# Patient Record
Sex: Female | Born: 1965
Health system: Southern US, Community
[De-identification: ages and names within clinical notes are randomized; demographics above are authoritative.]

## PROBLEM LIST (undated history)

## (undated) DIAGNOSIS — G43909 Migraine, unspecified, not intractable, without status migrainosus: Secondary | ICD-10-CM

## (undated) DIAGNOSIS — N751 Abscess of Bartholin's gland: Secondary | ICD-10-CM

## (undated) DIAGNOSIS — M1990 Unspecified osteoarthritis, unspecified site: Secondary | ICD-10-CM

## (undated) DIAGNOSIS — K649 Unspecified hemorrhoids: Secondary | ICD-10-CM

## (undated) DIAGNOSIS — R112 Nausea with vomiting, unspecified: Secondary | ICD-10-CM

## (undated) DIAGNOSIS — M5020 Other cervical disc displacement, unspecified cervical region: Secondary | ICD-10-CM

## (undated) DIAGNOSIS — M7989 Other specified soft tissue disorders: Secondary | ICD-10-CM

## (undated) DIAGNOSIS — N946 Dysmenorrhea, unspecified: Secondary | ICD-10-CM

## (undated) DIAGNOSIS — Z9889 Other specified postprocedural states: Secondary | ICD-10-CM

## (undated) HISTORY — DX: Abscess of Bartholin's gland: N75.1

## (undated) HISTORY — PX: ROTATOR CUFF REPAIR: SHX139

## (undated) HISTORY — PX: TYMPANOSTOMY TUBE PLACEMENT: SHX32

## (undated) HISTORY — DX: Migraine, unspecified, not intractable, without status migrainosus: G43.909

## (undated) HISTORY — PX: HEMORRHOID SURGERY: SHX153

## (undated) HISTORY — PX: COLONOSCOPY: SHX174

## (undated) HISTORY — DX: Dysmenorrhea, unspecified: N94.6

---

## 1996-02-22 HISTORY — PX: PILONIDAL CYST EXCISION: SHX744

## 1998-10-12 ENCOUNTER — Other Ambulatory Visit: Admission: RE | Admit: 1998-10-12 | Discharge: 1998-10-12 | Payer: Self-pay | Admitting: *Deleted

## 1999-10-17 ENCOUNTER — Other Ambulatory Visit: Admission: RE | Admit: 1999-10-17 | Discharge: 1999-10-17 | Payer: Self-pay | Admitting: *Deleted

## 2000-12-17 ENCOUNTER — Other Ambulatory Visit: Admission: RE | Admit: 2000-12-17 | Discharge: 2000-12-17 | Payer: Self-pay | Admitting: Obstetrics and Gynecology

## 2001-11-12 ENCOUNTER — Other Ambulatory Visit: Admission: RE | Admit: 2001-11-12 | Discharge: 2001-11-12 | Payer: Self-pay | Admitting: *Deleted

## 2002-03-23 ENCOUNTER — Encounter (INDEPENDENT_AMBULATORY_CARE_PROVIDER_SITE_OTHER): Payer: Self-pay | Admitting: *Deleted

## 2002-03-23 ENCOUNTER — Ambulatory Visit (HOSPITAL_BASED_OUTPATIENT_CLINIC_OR_DEPARTMENT_OTHER): Admission: RE | Admit: 2002-03-23 | Discharge: 2002-03-23 | Payer: Self-pay | Admitting: Surgery

## 2002-11-17 ENCOUNTER — Other Ambulatory Visit: Admission: RE | Admit: 2002-11-17 | Discharge: 2002-11-17 | Payer: Self-pay | Admitting: *Deleted

## 2003-10-11 ENCOUNTER — Encounter: Admission: RE | Admit: 2003-10-11 | Discharge: 2003-10-11 | Payer: Self-pay | Admitting: Family Medicine

## 2003-10-24 ENCOUNTER — Encounter: Admission: RE | Admit: 2003-10-24 | Discharge: 2003-10-24 | Payer: Self-pay | Admitting: Family Medicine

## 2003-11-23 ENCOUNTER — Other Ambulatory Visit: Admission: RE | Admit: 2003-11-23 | Discharge: 2003-11-23 | Payer: Self-pay | Admitting: *Deleted

## 2004-08-15 ENCOUNTER — Encounter: Admission: RE | Admit: 2004-08-15 | Discharge: 2004-08-15 | Payer: Self-pay | Admitting: Family Medicine

## 2004-10-07 ENCOUNTER — Emergency Department (HOSPITAL_COMMUNITY): Admission: EM | Admit: 2004-10-07 | Discharge: 2004-10-07 | Payer: Self-pay | Admitting: Emergency Medicine

## 2004-10-11 ENCOUNTER — Encounter: Admission: RE | Admit: 2004-10-11 | Discharge: 2004-10-11 | Payer: Self-pay | Admitting: Family Medicine

## 2004-11-29 ENCOUNTER — Ambulatory Visit (HOSPITAL_COMMUNITY): Admission: RE | Admit: 2004-11-29 | Discharge: 2004-11-30 | Payer: Self-pay | Admitting: Neurosurgery

## 2004-12-22 HISTORY — PX: CERVICAL DISCECTOMY: SHX98

## 2005-01-15 ENCOUNTER — Other Ambulatory Visit: Admission: RE | Admit: 2005-01-15 | Discharge: 2005-01-15 | Payer: Self-pay | Admitting: *Deleted

## 2006-01-16 ENCOUNTER — Other Ambulatory Visit: Admission: RE | Admit: 2006-01-16 | Discharge: 2006-01-16 | Payer: Self-pay | Admitting: Obstetrics & Gynecology

## 2006-01-27 ENCOUNTER — Encounter: Admission: RE | Admit: 2006-01-27 | Discharge: 2006-01-27 | Payer: Self-pay | Admitting: Obstetrics and Gynecology

## 2007-01-19 ENCOUNTER — Other Ambulatory Visit: Admission: RE | Admit: 2007-01-19 | Discharge: 2007-01-19 | Payer: Self-pay | Admitting: Obstetrics and Gynecology

## 2007-01-19 ENCOUNTER — Encounter: Admission: RE | Admit: 2007-01-19 | Discharge: 2007-01-19 | Payer: Self-pay | Admitting: Emergency Medicine

## 2007-02-24 ENCOUNTER — Encounter: Admission: RE | Admit: 2007-02-24 | Discharge: 2007-02-24 | Payer: Self-pay | Admitting: Emergency Medicine

## 2008-02-09 ENCOUNTER — Other Ambulatory Visit: Admission: RE | Admit: 2008-02-09 | Discharge: 2008-02-09 | Payer: Self-pay | Admitting: Obstetrics and Gynecology

## 2008-02-25 ENCOUNTER — Encounter: Admission: RE | Admit: 2008-02-25 | Discharge: 2008-02-25 | Payer: Self-pay | Admitting: Emergency Medicine

## 2009-02-02 ENCOUNTER — Encounter: Admission: RE | Admit: 2009-02-02 | Discharge: 2009-02-02 | Payer: Self-pay | Admitting: Emergency Medicine

## 2009-02-09 ENCOUNTER — Encounter: Admission: RE | Admit: 2009-02-09 | Discharge: 2009-02-09 | Payer: Self-pay | Admitting: Gastroenterology

## 2009-02-27 ENCOUNTER — Encounter: Admission: RE | Admit: 2009-02-27 | Discharge: 2009-02-27 | Payer: Self-pay | Admitting: Emergency Medicine

## 2010-03-02 ENCOUNTER — Encounter: Admission: RE | Admit: 2010-03-02 | Discharge: 2010-03-02 | Payer: Self-pay | Admitting: Family Medicine

## 2010-03-12 ENCOUNTER — Encounter: Admission: RE | Admit: 2010-03-12 | Discharge: 2010-03-12 | Payer: Self-pay | Admitting: Family Medicine

## 2010-10-14 ENCOUNTER — Encounter: Payer: Self-pay | Admitting: Family Medicine

## 2011-02-08 NOTE — Op Note (Signed)
NAMEJALINA, [name]              ACCOUNT NO.:  [id number]   MEDICAL RECORD NO.:  [id number]          PATIENT TYPE:  OIB   LOCATION:  2855                         FACILITY:  [facility]   PHYSICIAN:  Clydene Fake, M.D.  DATE OF BIRTH:  Jun 18, 1966   DATE OF PROCEDURE:  11/29/2004  DATE OF DISCHARGE:                                 OPERATIVE REPORT   DIAGNOSIS:  Herniated nucleus pulposus and spondylosis, left C5-6.   POSTOPERATIVE DIAGNOSIS:  Herniated nucleus pulposus and spondylosis, left  C5-6.   PROCEDURE:  Anterior cervical decompression, diskectomy and fusion, C5-6,  with LifeNet allograft bone and Eagle anterior cervical plates.   SURGEON:  Clydene Fake, M.D.   ASSISTANT:  Cristi Loron, M.D.   ANESTHESIA:  General endotracheal tube anesthesia.   ESTIMATED BLOOD LOSS:  Minimal.   BLOOD GIVEN:  None.   DRAINS:  None.   COMPLICATIONS:  None.   REASON FOR PROCEDURE:  The patient is a 45 year old woman with neck and left  arm pain and numbness, slight decreased sensation in the left C6 root,  diminished left biceps reflex.  MRI showed HNP on the left side at C5-6,  compressing the C6 root.  The patient was brought in for a decompression and  fusion.   PROCEDURE IN DETAIL:  The patient was brought into the operating room and  general anesthesia induced.  The patient was placed in halter traction with  10 pounds, and prepped and draped in a sterile fashion.  The area of the  incision was injected with 10 cc of 1% lidocaine with epinephrine.  Incision  was then made from the midline to the anterior border of the  sternocleidomastoid muscle on the left side of the neck.  The incision was  taken down to the platysma.  Hemostasis was obtained with Bovie  cauterization.  The Bovie was used to open the platysma.  Blunt dissection  was taken through the anterior cervical fascia.  The anterior cervical spine  needle was placed in the interspace.  X-rays were obtained  showing this was  the C5-6 interspace.  Disc space was incised with a 15 blade, and partial  diskectomy performed.  As the needle was removed, the longus coli muscles  were reflected laterally using the Bovie, and self-retaining retractor was  placed.  Diskectomy was then continued with pituitary rongeurs and curets.  Distraction pins were placed in the C5 and C6 interspace and distracted.  Anterior osteophytes were removed with Kerrison punches, 1 and 2 mm Kerrison  punches were used posteriorly to remove posterior osteophytes and ligament  and then disc herniation decompressed the ventral canal and then performed  bilateral foraminotomies.  Disc herniation was seen on the left side  lateral, compressing the C6 root, and this was removed, decompressing the  area.  __________ good central and lateral decompression bilaterally.  The  wound was irrigated with antibiotic solution.  Hemostasis was obtained with  Gelfoam and thrombin.  This was irrigated out.  Cartilaginous endplate was  drilled off with a high-speed drill.  Disc space measured to be 6 mm.  A 6  mm LifeNet allograft bone was then tapped into place and countersunk about 1  mm.  We checked posterior to the graft, and there was plenty of room between  the bone graft and dura.  Distraction was removed.  Weight was removed from  the traction, and an Eagle anterior cervical plate was placed around the  anterior cervical spine, with two screws placed into C5 and two into C6.  These were tightened down.  Lateral x-rays were obtained showing good  position of bone graft, plate, and screw at C5-6 level.  Retractor was  removed.  Hemostasis obtained with bipolar cauterization and Gelfoam and  thrombin.  The wound was irrigated with antibiotic solution, and the  platysma closed with 3-0 Vicryl interrupted suture, and the subcutaneous  tissue closed with the same, and the skin closed with Benzoin and Steri-  Strips.  Dressing was placed.   The patient was placed in a soft cervical  collar, awakened from anesthesia and transferred to the recovery room in  stable condition.      JRH/MEDQ  D:  11/29/2004  T:  11/29/2004  Job:  [id number]

## 2011-02-08 NOTE — Op Note (Signed)
[name], [name]              ACCOUNT NO.:  [id number]   MEDICAL RECORD NO.:  [id number]          PATIENT TYPE:  OIB   LOCATION:  3023                         FACILITY:  [facility]   PHYSICIAN:  Clydene Fake, M.D.  DATE OF BIRTH:  October 21, 1965   DATE OF PROCEDURE:  11/29/2004  DATE OF DISCHARGE:  11/30/2004                                 OPERATIVE REPORT   This is a redictation of a previously dictated note.   DIAGNOSIS:  Herniated nucleus pulposus, spondylosis C5-6 with left-sided  radiculopathy.   POSTOPERATIVE DIAGNOSIS:  Herniated nucleus pulposus, spondylosis C5-6 with  left-sided radiculopathy.   PROCEDURE:  Anterior cervical decompression and discectomy and fusion at 5-6  with LifeNet allograft bone and Eagle anterior cervical plate.   SURGEON:  Clydene Fake, M.D.   ASSISTANT:  Cristi Loron, M.D.   ANESTHESIA:  General endotracheal tube anesthesia.   ESTIMATED BLOOD LOSS:  Minimal.   BLOOD GIVEN:  None.   DRAINS:  None.   COMPLICATIONS:  None.   INDICATIONS FOR PROCEDURE:  Patient is a 45 year old woman who has had back  and left arm pain and numbness, found to have decreased sensation left C6  distribution, diminished reflexes in the left biceps and positive Spurling's  at the left.  An MRI is done showing disc herniation in the left side  compressing the left C6 nerve root.  Patient brought in for decompression  and fusion.   DESCRIPTION OF PROCEDURE:  Patient is brought to the operating room and  general anesthesia is induced.  Patient is placed in 10 pounds Halter  traction, prepped and draped in a sterile fashion.  Site of incision was  injected with 10 mL of 1% lidocaine with epinephrine.  Incision was then  made from the midline to the anterior border of the sternocleidomastoid  muscle and the left-sided neck incision taken down to the platysma and  hemostasis obtained with Bovie cauterization and platysma incised with the  Bovie and blunt  dissection taken down through the anterior cervical fascia,  anterior cervical spine.  Needle was placed in interspace, x-rays obtained  showing this at the 5-6 interspace.  Disc space was incised with 15 blade  and compression discectomy performed as the needle was removed.  Longus  colli muscle was reflected laterally on the inside using the Bovie and self-  retaining retractor was placed.  Microscope was brought in for  microdissection at this point and discectomy was continued with curettes and  pituitary rongeurs and then 1 and 2 mm Kerrison punches were used to remove  posterior osteophytes, posterior ligaments and disc herniation for bilateral  foraminotomies were performed.  Decompression of the nerve roots bilaterally  especially on the left side with removal of herniated disc.  Distraction  pins were placed in the C5 and 6 interspace bones and the interspace  distracted prior to the discectomy.  Then cartilaginous end plates were  drilled with a high speed drill and height of the vertebral body was  measured with the LifeNet bone trials.  LifeNet allograft bone was then  tapped into place  __________ about a millimeter.  We checked posterior to  the graft and there was plenty of room between the dura and the bone graft.  Wound was irrigated with antibiotic solution.  Distraction was removed.  Weight was removed from the traction and it was used at the beginning of the  case.  We still had good position of the bone graft.  Eagle anterior  cervical plate was placed on the anterior cervical spine and two screws  placed in the C5, two in the C6.  These were tightened down.  Another x-ray  was obtained showing he had fusion 5-6 level with good position of bone  graft, plate and screws.  Retractors removed.  Hemostasis obtained with  bipolar cauterization, Gelfoam and thrombin.  Gelfoam was irrigated out when  he had good hemostasis, then platysma was closed with 3-0 Vicryl interrupted   sutures, the subcutaneous tissue closed with same, skin closed with Benzoin  and Steri-Strips.  Dressing was placed, patient was placed back in soft  cervical collar, awoken from anesthesia and transferred to the recovery room  in stable condition.      JRH/MEDQ  D:  12/27/2004  T:  12/27/2004  Job:  [id number]   cc:   Talmadge Coventry, M.D.  [address]  [place]  [place] [address]  Fax: [phone]

## 2011-02-08 NOTE — Op Note (Signed)
[facility]  Patient:    [name], [name] Visit Number: [id number] MRN: [id number]          Service Type: DSU Location: [facility] Attending Physician:  Bonnetta Barry Dictated by:   Velora Heckler, M.D. Proc. Date: 03/23/02 Admit Date:  03/23/2002 Discharge Date: 03/23/2002   CC:         Talmadge Coventry, M.D.   Operative Report  PREOPERATIVE DIAGNOSIS:  Complex hemorrhoid with prolapse and thrombosis.  POSTOPERATIVE DIAGNOSIS:  Complex hemorrhoid with prolapse and thrombosis.  OPERATION PERFORMED:  Hemorrhoidectomy (left lateral column).  SURGEON:  Velora Heckler, M.D.  ANESTHESIA:  General.  ESTIMATED BLOOD LOSS:  Minimal.  PREPARATION:  Betadine.  COMPLICATIONS:  None.  INDICATIONS FOR PROCEDURE:  The patient is a 45 year old white female known to my practice with prior hemorrhoidectomy in March of 1999.  The patient has now developed prolapsed, intermittent bleeding and pain.  She failed conservative management with tub soaks, stool softeners, and topical steroid creams.  She now comes to surgery for excision.  DESCRIPTION OF PROCEDURE:  The procedure was done in OR #2 at the [facility].  The patient was brought to the operating room and placed in supine position on the operating room table.  Following administration of general anesthesia, the patient was placed in lithotomy and prepped and draped in the usual strict aseptic fashion.  After ascertaining that an adequate level of anesthesia had been obtained, the digital rectal exam was performed. The left lateral column is prominent with prolapse and thrombosis.  Anoscopy was performed.  Local anesthetic with epinephrine was injected all along the hemorrhoidal column and onto the perianal skin.  A V-shaped incision was then made on the anoderm.  Dissection was carried into the subcutaneous tissues. Dissection was carried proximally.  A 3-0 chromic gut  suture ligature was placed at the apex of the hemorrhoidal column.  The entire hemorrhoidal column was then dissected away from the sphincter muscle and into the dentate line with sharp dissection.  The column was excised and submitted as specimen.  The mucosa was reapproximated with running 3-0 chromic gut locking suture.  The suture was carried over the sphincter musculature and onto the anoderm. Further local anesthetic was injected.  Good hemostasis was noted.   Repeat anoscopy was performed.  No other prominent columns were visible.  Gelfoam was placed in the anal canal.  Sterile gauze dressings were placed on the perineum.  The patient was taken out of lithotomy and awakened from anesthesia.  She was brought to the recovery room in stable condition.  The patient tolerated the procedure well. Dictated by:   Velora Heckler, M.D. Attending Physician:  Bonnetta Barry DD:  03/23/02 TD:  03/24/02 Job: [id number]

## 2011-02-12 ENCOUNTER — Other Ambulatory Visit: Payer: Self-pay | Admitting: Family Medicine

## 2011-02-12 DIAGNOSIS — Z1231 Encounter for screening mammogram for malignant neoplasm of breast: Secondary | ICD-10-CM

## 2011-03-15 ENCOUNTER — Ambulatory Visit
Admission: RE | Admit: 2011-03-15 | Discharge: 2011-03-15 | Disposition: A | Discharge status: Home / Self Care | Payer: BC Managed Care – PPO | Source: Ambulatory Visit | Attending: Family Medicine | Admitting: Family Medicine

## 2011-03-15 DIAGNOSIS — Z1231 Encounter for screening mammogram for malignant neoplasm of breast: Secondary | ICD-10-CM

## 2012-01-17 ENCOUNTER — Other Ambulatory Visit: Payer: Self-pay | Admitting: Family Medicine

## 2012-01-17 DIAGNOSIS — N6311 Unspecified lump in the right breast, upper outer quadrant: Secondary | ICD-10-CM

## 2012-01-20 ENCOUNTER — Ambulatory Visit
Admission: RE | Admit: 2012-01-20 | Discharge: 2012-01-20 | Disposition: A | Discharge status: Home / Self Care | Payer: BC Managed Care – PPO | Source: Ambulatory Visit | Attending: Family Medicine | Admitting: Family Medicine

## 2012-01-20 DIAGNOSIS — N6311 Unspecified lump in the right breast, upper outer quadrant: Secondary | ICD-10-CM

## 2012-03-02 ENCOUNTER — Other Ambulatory Visit: Payer: Self-pay | Admitting: Family Medicine

## 2012-03-02 DIAGNOSIS — Z1231 Encounter for screening mammogram for malignant neoplasm of breast: Secondary | ICD-10-CM

## 2012-03-23 ENCOUNTER — Ambulatory Visit
Admission: RE | Admit: 2012-03-23 | Discharge: 2012-03-23 | Disposition: A | Discharge status: Home / Self Care | Payer: BC Managed Care – PPO | Source: Ambulatory Visit | Attending: Family Medicine | Admitting: Family Medicine

## 2012-03-23 DIAGNOSIS — Z1231 Encounter for screening mammogram for malignant neoplasm of breast: Secondary | ICD-10-CM

## 2012-12-21 ENCOUNTER — Ambulatory Visit: Payer: BC Managed Care – PPO | Admitting: Gynecology

## 2013-02-05 ENCOUNTER — Encounter: Payer: Self-pay | Admitting: Certified Nurse Midwife

## 2013-02-05 ENCOUNTER — Ambulatory Visit (INDEPENDENT_AMBULATORY_CARE_PROVIDER_SITE_OTHER): Payer: BC Managed Care – PPO | Admitting: Nurse Practitioner

## 2013-02-05 ENCOUNTER — Encounter: Payer: Self-pay | Admitting: Nurse Practitioner

## 2013-02-05 VITALS — BP 110/64 | HR 68 | Resp 16 | Wt 133.0 lb

## 2013-02-05 DIAGNOSIS — B373 Candidiasis of vulva and vagina: Secondary | ICD-10-CM

## 2013-02-05 LAB — POCT WET PREP (WET MOUNT)

## 2013-02-05 MED ORDER — FLUCONAZOLE 150 MG PO TABS: 150.0000 mg | ORAL_TABLET | Freq: Once | ORAL | Status: DC

## 2013-02-05 NOTE — Progress Notes (Signed)
47 y.o.Single Caucasian female [id number]  with a history of yeast symptoms for 2 day(s).  History of the following:local irritation , slight vaginal discharge.  No UA symptoms. No recent antibiotics.  Recently doing healthy life style with an increase exercise with sweating while working on treadmill and not bathing after activity. Sexually active: yes Last sexual activity:2days ago. Patient has not tried over the counter treatment as they have e been ineffective in the past.    ROS:  She has been followed by Dr. Hyacinth Meeker for a Bartholin cyst on the right vulva.  She is scheduled to return in 3 weeks - but now with no symptoms wonders if she needs to come in. She denies any other constitutional symptoms.     Exam:  EG/ BUS: normal color with out exudate.   There is a small pea size  Bartholin gland on the right that is not red, tender or exudate.                ZOX:[id number]: white creamy                Cx:  normal appearance                Uterus:normal size                Adnexa: normal adnexa  Wet Prep shows:yeast   Dx: monilia vaginitis  History of Bartholin gland cyst being followed by dr. Hyacinth Meeker   Tx :Oral antifungal see orders.  Patient request that Dr.Miller see chart and decide if she needs appointment in 3 wk's.   If not needs to be called.  After visit summary given to patient   Reviewed personally.  MSM

## 2013-02-05 NOTE — Patient Instructions (Signed)
Monilial Vaginitis Vaginitis in a soreness, swelling and redness (inflammation) of the vagina and vulva. Monilial vaginitis is not a sexually transmitted infection. CAUSES  Yeast vaginitis is caused by yeast (candida) that is normally found in your vagina. With a yeast infection, the candida has overgrown in number to a point that upsets the chemical balance. SYMPTOMS   White, thick vaginal discharge.  Swelling, itching, redness and irritation of the vagina and possibly the lips of the vagina (vulva).  Burning or painful urination.  Painful intercourse. DIAGNOSIS  Things that may contribute to monilial vaginitis are:  Postmenopausal and virginal states.  Pregnancy.  Infections.  Being tired, sick or stressed, especially if you had monilial vaginitis in the past.  Diabetes. Good control will help lower the chance.  Birth control pills.  Tight fitting garments.  Using bubble bath, feminine sprays, douches or deodorant tampons.  Taking certain medications that kill germs (antibiotics).  Sporadic recurrence can occur if you become ill. TREATMENT  Your caregiver will give you medication.  There are several kinds of anti monilial vaginal creams and suppositories specific for monilial vaginitis. For recurrent yeast infections, use a suppository or cream in the vagina 2 times a week, or as directed.  Anti-monilial or steroid cream for the itching or irritation of the vulva may also be used. Get your caregiver's permission.  Painting the vagina with methylene blue solution may help if the monilial cream does not work.  Eating yogurt may help prevent monilial vaginitis. HOME CARE INSTRUCTIONS   Finish all medication as prescribed.  Do not have sex until treatment is completed or after your caregiver tells you it is okay.  Take warm sitz baths.  Do not douche.  Do not use tampons, especially scented ones.  Wear cotton underwear.  Avoid tight pants and panty  hose.  Tell your sexual partner that you have a yeast infection. They should go to their caregiver if they have symptoms such as mild rash or itching.  Your sexual partner should be treated as well if your infection is difficult to eliminate.  Practice safer sex. Use condoms.  Some vaginal medications cause latex condoms to fail. Vaginal medications that harm condoms are:  Cleocin cream.  Butoconazole (Femstat).  Terconazole (Terazol) vaginal suppository.  Miconazole (Monistat) (may be purchased over the counter). SEEK MEDICAL CARE IF:   You have a temperature by mouth above 102 F (38.9 C).  The infection is getting worse after 2 days of treatment.  The infection is not getting better after 3 days of treatment.  You develop blisters in or around your vagina.  You develop vaginal bleeding, and it is not your menstrual period.  You have pain when you urinate.  You develop intestinal problems.  You have pain with sexual intercourse. Document Released: 06/19/2005 Document Revised: 12/02/2011 Document Reviewed: 03/03/2009 Exit[employer] Patient Information 2013 [employer].  

## 2013-02-07 NOTE — Progress Notes (Signed)
Can you pull this chart for me?  Thanks.

## 2013-02-24 ENCOUNTER — Ambulatory Visit: Payer: Self-pay | Admitting: Obstetrics & Gynecology

## 2013-03-04 ENCOUNTER — Ambulatory Visit (INDEPENDENT_AMBULATORY_CARE_PROVIDER_SITE_OTHER): Payer: BC Managed Care – PPO | Admitting: Gynecology

## 2013-03-04 VITALS — BP 102/60 | HR 68 | Resp 12 | Ht 62.0 in | Wt 135.0 lb

## 2013-03-04 DIAGNOSIS — N750 Cyst of Bartholin's gland: Secondary | ICD-10-CM

## 2013-03-04 NOTE — Progress Notes (Signed)
Subjective:     Patient ID: [name], female   DOB: Feb 06, 1966, 47 y.o.   MRN: [id number]  HPI Comments: Pt here for follow up of persistent enlarged batholin's gland, pt reports cyst increases usually after menses and decreases again afterwards.  Pt usually cannot feel it but reports that her partner is able to keep and eye on it.  Pt denies any other symptoms. Pt states that she and her partner of 22y have just gotten married last month and are very happy. Denies dyspareunia    Review of Systems Per HPI    Objective:   Physical Exam  Constitutional: She appears well-developed and well-nourished.  Genitourinary:          Assessment:     Barthollins cyst     Plan:     Remains unchanged over several months surveillance, will keep aware of it, no further intervention recommended at this time

## 2013-03-24 ENCOUNTER — Other Ambulatory Visit: Payer: Self-pay

## 2013-03-24 DIAGNOSIS — Z1231 Encounter for screening mammogram for malignant neoplasm of breast: Secondary | ICD-10-CM

## 2013-04-15 ENCOUNTER — Ambulatory Visit
Admission: RE | Admit: 2013-04-15 | Discharge: 2013-04-15 | Disposition: A | Discharge status: Home / Self Care | Payer: BC Managed Care – PPO | Source: Ambulatory Visit

## 2013-04-15 DIAGNOSIS — Z1231 Encounter for screening mammogram for malignant neoplasm of breast: Secondary | ICD-10-CM

## 2013-06-28 ENCOUNTER — Ambulatory Visit (INDEPENDENT_AMBULATORY_CARE_PROVIDER_SITE_OTHER): Payer: BC Managed Care – PPO | Admitting: Nurse Practitioner

## 2013-06-28 ENCOUNTER — Encounter: Payer: Self-pay | Admitting: Nurse Practitioner

## 2013-06-28 VITALS — BP 130/80 | HR 68 | Ht 62.0 in | Wt 138.0 lb

## 2013-06-28 DIAGNOSIS — Z0000 Encounter for general adult medical examination without abnormal findings: Secondary | ICD-10-CM

## 2013-06-28 DIAGNOSIS — Z01419 Encounter for gynecological examination (general) (routine) without abnormal findings: Secondary | ICD-10-CM

## 2013-06-28 LAB — POCT URINALYSIS DIPSTICK
Glucose, UA: NEGATIVE
Leukocytes, UA: NEGATIVE
Nitrite, UA: NEGATIVE
Urobilinogen, UA: NEGATIVE

## 2013-06-28 LAB — HEMOGLOBIN, FINGERSTICK: Hemoglobin, fingerstick: 13.5 g/dL (ref 12.0–16.0)

## 2013-06-28 MED ORDER — FLUCONAZOLE 150 MG PO TABS: 150.0000 mg | ORAL_TABLET | Freq: Once | ORAL | Status: DC

## 2013-06-28 NOTE — Progress Notes (Signed)
Patient ID: [name], female   DOB: 10/27/1965, 47 y.o.   MRN: [id number] 47 y.o. G0 Married Caucasian Fe here for annual exam.  Skipped cycle in September. No symptoms now of bloating or breast tenderness.  Patient and her partner were married in [place] this past May. She is very happy.  Patient's last menstrual period was 05/07/2013.          Sexually active: yes  The current method of family planning is none.    Exercising: no  The patient does not participate in regular exercise at present. Smoker:  no  Health Maintenance: Pap:  06/25/12, WNL, neg HR HPV MMG:  04/16/13, BI-Rads 1: negative TDaP:  06/25/12 Labs: HB: 13.5 Urine: trace RBC, pH 6.0   reports that she has quit smoking. She does not have any smokeless tobacco history on file. She reports that she does not drink alcohol or use illicit drugs.  Past Medical History  Diagnosis Date  . Dysmenorrhea   . Migraines   . Vaginal cyst     Past Surgical History  Procedure Laterality Date  . Tympanostomy tube placement    . Pilonidal cyst excision    . Hemorrhoid surgery    . Cervical discectomy      Current Outpatient Prescriptions  Medication Sig Dispense Refill  . celecoxib (CELEBREX) 200 MG capsule Take 200 mg by mouth as needed.       . Multiple Vitamins-Minerals (MULTIVITAMIN PO) Take by mouth daily.        No current facility-administered medications for this visit.    Family History  Problem Relation Age of Onset  . Diabetes Mother   . Aneurysm Father     abdominal aneurysm  . Aneurysm Paternal Uncle     abdominal aneurysm  . Diabetes Maternal Grandmother   . Diabetes Maternal Grandfather   . Stroke Maternal Grandfather   . Hypertension Maternal Grandfather     ROS:  Pertinent items are noted in HPI.  Otherwise, a comprehensive ROS was negative.  Exam:   BP 130/80  Pulse 68  Ht 5\' 2"  (1.575 m)  Wt 138 lb (62.596 kg)  BMI 25.23 kg/m2  LMP 05/07/2013 Height: 5\' 2"  (157.5 cm)  Ht Readings from  Last 3 Encounters:  06/28/13 5\' 2"  (1.575 m)  03/04/13 5\' 2"  (1.575 m)    General appearance: alert, cooperative and appears stated age Head: Normocephalic, without obvious abnormality, atraumatic Neck: no adenopathy, supple, symmetrical, trachea midline and thyroid normal to inspection and palpation Lungs: clear to auscultation bilaterally Breasts: normal appearance, no masses or tenderness Heart: regular rate and rhythm Abdomen: soft, non-tender; no masses,  no organomegaly Extremities: extremities normal, atraumatic, no cyanosis or edema Skin: Skin color, texture, turgor normal. No rashes or lesions Lymph nodes: Cervical, supraclavicular, and axillary nodes normal. No abnormal inguinal nodes palpated Neurologic: Grossly normal   Pelvic: External genitalia:  no lesions              Urethra:  normal appearing urethra with no masses, tenderness or lesions              Bartholin's and Skene's: right bartholin cyst is barely palpable - maybe just scar tissue. No exudate.              Vagina: normal appearing vagina with normal color and discharge, no lesions              Cervix: anteverted  Pap taken: no Bimanual Exam:  Uterus:  normal size, contour, position, consistency, mobility, non-tender              Adnexa: no mass, fullness, tenderness               Rectovaginal: Confirms               Anus:  normal sphincter tone, no lesions  A:  Well Woman with normal exam  Irregular menses c/w perimenopause  Female relationship  History of right Bartholin gland cyst with I & D 08/2012  P:   Pap smear as per guidelines   Mammogram due 7/15  Menses record and call if no menses in 3 months  Counseled on breast self exam, adequate intake of calcium and vitamin D, diet and exercise return annually or prn  An After Visit Summary was printed and given to the patient.

## 2013-06-28 NOTE — Patient Instructions (Signed)

## 2013-06-30 NOTE — Progress Notes (Signed)
Encounter reviewed by Dr. Brook Silva.  

## 2013-08-17 ENCOUNTER — Telehealth: Payer: Self-pay | Admitting: Nurse Practitioner

## 2013-08-17 ENCOUNTER — Ambulatory Visit (INDEPENDENT_AMBULATORY_CARE_PROVIDER_SITE_OTHER): Payer: BC Managed Care – PPO | Admitting: Gynecology

## 2013-08-17 ENCOUNTER — Encounter: Payer: Self-pay | Admitting: Gynecology

## 2013-08-17 VITALS — BP 108/68 | HR 64 | Resp 12 | Ht 62.0 in | Wt 141.0 lb

## 2013-08-17 DIAGNOSIS — N750 Cyst of Bartholin's gland: Secondary | ICD-10-CM

## 2013-08-17 MED ORDER — HYDROMORPHONE HCL 2 MG PO TABS: 2.0000 mg | ORAL_TABLET | ORAL | Status: DC | PRN

## 2013-08-17 NOTE — Progress Notes (Signed)
Subjective:     Patient ID: [name], female   DOB: 05/10/1966, 47 y.o.   MRN: [id number]  HPI Comments: Pt here with recurrent bartholins abcess.  Pt had been dealing with this for over 2y.  She states that it seems to get larger with her menses.  Pt had an attempted I&D with ward catheter placement in the past but was found to have many loculations that limited placement of the catheter.  She reports that this episode began a few days ago and has continued to increase in size so that she cannot sit well.    Review of Systems  Constitutional: Negative for fever, chills and fatigue.  Genitourinary: Positive for vaginal pain (labial) and dyspareunia. Negative for vaginal bleeding, vaginal discharge and menstrual problem.       Objective:   Physical Exam  Constitutional: She appears well-developed and well-nourished.  Cardiovascular: Normal rate, regular rhythm and normal heart sounds.   Pulmonary/Chest: Effort normal and breath sounds normal. No respiratory distress. She has no wheezes. She has no rales.  Abdominal: Soft. She exhibits no distension. There is no tenderness. There is no rebound.  Genitourinary:    There is tenderness and lesion on the right labia. There is no rash on the right labia. There is no rash or lesion on the left labia.  Unable to pass speculum or bimanual exam due to pt discomfort  Lymphadenopathy:       Right: No inguinal adenopathy present.       Left: No inguinal adenopathy present.       Assessment:     Recurrent bartholins cyst      Plan:     Will marsupialize in OR in am Risks and benefits discussed Procedure explained to pt at length, questions addressed rx for dilaudid given for post-op      20m spent discussing treatment of bartholins cyst and pre and postop instructions given Pt scheduled for 8:45am 11/26 To call if cyst ruptures over night 

## 2013-08-17 NOTE — Telephone Encounter (Signed)
Last AEX 09/2012 with Patty. Hx bartholin cyst, last I&D 08-2012. Patient states it flared up one week ago and is progressively getting worse despite sitz bath and massage. Swelling into vagina Very uncomfortable. Denies fever. OV today at 1pm with Dr Farrel Gobble who checked this last in Dec 2013.   Routing to provider for final review. Patient agreeable to disposition. Will close encounter

## 2013-08-17 NOTE — Telephone Encounter (Signed)
Pt states her cyst is back and she would like to come in today to be seen.

## 2013-08-17 NOTE — Patient Instructions (Signed)
Sitz bath afterwards No sex until released post-op visit

## 2013-08-18 ENCOUNTER — Ambulatory Visit (HOSPITAL_COMMUNITY): Payer: BC Managed Care – PPO | Admitting: Anesthesiology

## 2013-08-18 ENCOUNTER — Ambulatory Visit (HOSPITAL_COMMUNITY)
Admission: RE | Admit: 2013-08-18 | Discharge: 2013-08-18 | Disposition: A | Discharge status: Home / Self Care | Payer: BC Managed Care – PPO | Source: Ambulatory Visit | Attending: Gynecology | Admitting: Gynecology

## 2013-08-18 ENCOUNTER — Encounter (HOSPITAL_COMMUNITY): Payer: Self-pay | Admitting: Anesthesiology

## 2013-08-18 ENCOUNTER — Encounter (HOSPITAL_COMMUNITY): Payer: BC Managed Care – PPO | Admitting: Anesthesiology

## 2013-08-18 ENCOUNTER — Encounter (HOSPITAL_COMMUNITY)
Admission: RE | Disposition: A | Discharge status: Home / Self Care | Payer: Self-pay | Source: Ambulatory Visit | Attending: Gynecology

## 2013-08-18 ENCOUNTER — Other Ambulatory Visit: Payer: Self-pay | Admitting: Gynecology

## 2013-08-18 DIAGNOSIS — N750 Cyst of Bartholin's gland: Secondary | ICD-10-CM

## 2013-08-18 DIAGNOSIS — Z9889 Other specified postprocedural states: Secondary | ICD-10-CM

## 2013-08-18 HISTORY — PX: BARTHOLIN CYST MARSUPIALIZATION: SHX5383

## 2013-08-18 LAB — CBC
HCT: 40.7 % (ref 36.0–46.0)
MCH: 33.1 pg (ref 26.0–34.0)
MCHC: 35.4 g/dL (ref 30.0–36.0)
MCV: 93.6 fL (ref 78.0–100.0)
Platelets: 323 10*3/uL (ref 150–400)
RDW: 12.6 % (ref 11.5–15.5)
WBC: 16.1 10*3/uL — ABNORMAL HIGH (ref 4.0–10.5)

## 2013-08-18 SURGERY — MARSUPIALIZATION, CYST, BARTHOLIN'S GLAND
Anesthesia: General | Site: Vulva | Laterality: Right | Wound class: Dirty or Infected

## 2013-08-18 MED ORDER — ONDANSETRON HCL 4 MG/2ML IJ SOLN
INTRAMUSCULAR | Status: AC
  Filled 2013-08-18: qty 2

## 2013-08-18 MED ORDER — LIDOCAINE HCL (CARDIAC) 20 MG/ML IV SOLN
INTRAVENOUS | Status: AC
  Filled 2013-08-18: qty 5

## 2013-08-18 MED ORDER — MEPERIDINE HCL 25 MG/ML IJ SOLN: 6.2500 mg | INTRAMUSCULAR | Status: DC | PRN

## 2013-08-18 MED ORDER — FENTANYL CITRATE 0.05 MG/ML IJ SOLN
INTRAMUSCULAR | Status: DC | PRN
  Administered 2013-08-18: 5 ug via INTRAVENOUS
  Administered 2013-08-18 (×2): 50 ug via INTRAVENOUS
  Administered 2013-08-18 (×2): 5 ug via INTRAVENOUS

## 2013-08-18 MED ORDER — MIDAZOLAM HCL 2 MG/2ML IJ SOLN
INTRAMUSCULAR | Status: DC | PRN
  Administered 2013-08-18: 2 mg via INTRAVENOUS

## 2013-08-18 MED ORDER — LIDOCAINE HCL 2 % IJ SOLN
INTRAMUSCULAR | Status: AC
  Filled 2013-08-18: qty 20

## 2013-08-18 MED ORDER — KETOROLAC TROMETHAMINE 30 MG/ML IJ SOLN: 15.0000 mg | Freq: Once | INTRAMUSCULAR | Status: DC | PRN

## 2013-08-18 MED ORDER — LIDOCAINE HCL 2 % IJ SOLN
INTRAMUSCULAR | Status: DC | PRN
  Administered 2013-08-18: 5 mL

## 2013-08-18 MED ORDER — PROMETHAZINE HCL 25 MG/ML IJ SOLN: 6.2500 mg | INTRAMUSCULAR | Status: DC | PRN

## 2013-08-18 MED ORDER — SODIUM CHLORIDE 0.9 % IR SOLN
Status: DC | PRN
  Administered 2013-08-18: 1000 mL

## 2013-08-18 MED ORDER — KETOROLAC TROMETHAMINE 30 MG/ML IJ SOLN
INTRAMUSCULAR | Status: DC | PRN
  Administered 2013-08-18 (×2): 30 mg via INTRAVENOUS

## 2013-08-18 MED ORDER — FENTANYL CITRATE 0.05 MG/ML IJ SOLN
25.0000 ug | INTRAMUSCULAR | Status: DC | PRN
  Administered 2013-08-18 (×2): 25 ug via INTRAVENOUS

## 2013-08-18 MED ORDER — MIDAZOLAM HCL 2 MG/2ML IJ SOLN: 0.5000 mg | Freq: Once | INTRAMUSCULAR | Status: DC | PRN

## 2013-08-18 MED ORDER — GLYCOPYRROLATE 0.2 MG/ML IJ SOLN
INTRAMUSCULAR | Status: DC | PRN
  Administered 2013-08-18: 0.3 mg via INTRAVENOUS

## 2013-08-18 MED ORDER — LACTATED RINGERS IV SOLN: INTRAVENOUS | Status: DC

## 2013-08-18 MED ORDER — LACTATED RINGERS IV SOLN
INTRAVENOUS | Status: DC
  Administered 2013-08-18 (×2): via INTRAVENOUS

## 2013-08-18 MED ORDER — GLYCOPYRROLATE 0.2 MG/ML IJ SOLN
INTRAMUSCULAR | Status: AC
  Filled 2013-08-18: qty 2

## 2013-08-18 MED ORDER — MIDAZOLAM HCL 2 MG/2ML IJ SOLN
INTRAMUSCULAR | Status: AC
  Filled 2013-08-18: qty 2

## 2013-08-18 MED ORDER — FENTANYL CITRATE 0.05 MG/ML IJ SOLN
INTRAMUSCULAR | Status: AC
  Filled 2013-08-18: qty 5

## 2013-08-18 MED ORDER — BUPIVACAINE-EPINEPHRINE PF 0.25-1:200000 % IJ SOLN
INTRAMUSCULAR | Status: AC
  Filled 2013-08-18: qty 30

## 2013-08-18 MED ORDER — KETOROLAC TROMETHAMINE 30 MG/ML IJ SOLN
INTRAMUSCULAR | Status: AC
  Filled 2013-08-18: qty 1

## 2013-08-18 MED ORDER — FENTANYL CITRATE 0.05 MG/ML IJ SOLN
INTRAMUSCULAR | Status: AC
  Filled 2013-08-18: qty 2

## 2013-08-18 MED ORDER — PROPOFOL 10 MG/ML IV EMUL
INTRAVENOUS | Status: AC
  Filled 2013-08-18: qty 20

## 2013-08-18 MED ORDER — CEFAZOLIN SODIUM-DEXTROSE 2-3 GM-% IV SOLR
INTRAVENOUS | Status: AC
  Filled 2013-08-18: qty 50

## 2013-08-18 MED ORDER — METOCLOPRAMIDE HCL 10 MG PO TABS: 10.0000 mg | ORAL_TABLET | Freq: Four times a day (QID) | ORAL | Status: DC | PRN

## 2013-08-18 MED ORDER — PROPOFOL 10 MG/ML IV BOLUS
INTRAVENOUS | Status: DC | PRN
  Administered 2013-08-18: 170 mg via INTRAVENOUS

## 2013-08-18 MED ORDER — LIDOCAINE-EPINEPHRINE 1 %-1:100000 IJ SOLN
INTRAMUSCULAR | Status: AC
  Filled 2013-08-18: qty 1

## 2013-08-18 MED ORDER — LIDOCAINE HCL (CARDIAC) 20 MG/ML IV SOLN
INTRAVENOUS | Status: DC | PRN
  Administered 2013-08-18: 30 mg via INTRAVENOUS
  Administered 2013-08-18: 70 mg via INTRAVENOUS

## 2013-08-18 MED ORDER — CEFAZOLIN SODIUM-DEXTROSE 2-3 GM-% IV SOLR
INTRAVENOUS | Status: DC | PRN
  Administered 2013-08-18: 2 g via INTRAVENOUS

## 2013-08-18 MED ORDER — ONDANSETRON HCL 4 MG/2ML IJ SOLN
INTRAMUSCULAR | Status: DC | PRN
  Administered 2013-08-18: 4 mg via INTRAVENOUS

## 2013-08-18 SURGICAL SUPPLY — 28 items
BLADE SURG 15 STRL LF C SS BP (BLADE) ×1 IMPLANT
BLADE SURG 15 STRL SS (BLADE) ×1
CONTAINER PREFILL 10% NBF 15ML (MISCELLANEOUS) ×2 IMPLANT
COUNTER NEEDLE 1200 MAGNETIC (NEEDLE) ×2 IMPLANT
DRAPE STERI URO 9X17 APER PCH (DRAPES) ×2 IMPLANT
DRESSING TELFA 8X3 (GAUZE/BANDAGES/DRESSINGS) ×2 IMPLANT
ELECT CAUTERY BLADE 6.4 (BLADE) IMPLANT
ELECT REM PT RETURN 9FT ADLT (ELECTROSURGICAL) ×2
ELECTRODE REM PT RTRN 9FT ADLT (ELECTROSURGICAL) ×1 IMPLANT
GLOVE BIOGEL M 6.5 STRL (GLOVE) ×2 IMPLANT
GLOVE BIOGEL PI IND STRL 6.5 (GLOVE) ×2 IMPLANT
GLOVE BIOGEL PI INDICATOR 6.5 (GLOVE) ×2
GOWN STRL REIN XL XLG (GOWN DISPOSABLE) ×4 IMPLANT
NEEDLE HYPO 25X1 1.5 SAFETY (NEEDLE) ×2 IMPLANT
PACK VAGINAL MINOR WOMEN LF (CUSTOM PROCEDURE TRAY) ×2 IMPLANT
PAD OB MATERNITY 4.3X12.25 (PERSONAL CARE ITEMS) ×2 IMPLANT
PAD PREP 24X48 CUFFED NSTRL (MISCELLANEOUS) ×2 IMPLANT
PENCIL BUTTON HOLSTER BLD 10FT (ELECTRODE) ×2 IMPLANT
SUT CHROMIC 3 0 SH 27 (SUTURE) IMPLANT
SUT VIC AB 3-0 SH 27 (SUTURE) ×2
SUT VIC AB 3-0 SH 27X BRD (SUTURE) ×2 IMPLANT
SWAB CULTURE LIQ STUART DBL (MISCELLANEOUS) IMPLANT
SYR BULB 3OZ (MISCELLANEOUS) ×2 IMPLANT
SYR CONTROL 10ML LL (SYRINGE) ×2 IMPLANT
TOWEL OR 17X24 6PK STRL BLUE (TOWEL DISPOSABLE) ×4 IMPLANT
TUBE ANAEROBIC SPECIMEN COL (MISCELLANEOUS) IMPLANT
TUBING NON-CON 1/4 X 20 CONN (TUBING) ×2 IMPLANT
YANKAUER SUCT BULB TIP NO VENT (SUCTIONS) ×2 IMPLANT

## 2013-08-18 NOTE — Interval H&P Note (Signed)
History and Physical Interval Note:  08/18/2013 8:24 AM  [name]  has presented today for surgery, with the diagnosis of recurrent bartholin cyst  The various methods of treatment have been discussed with the patient and family. After consideration of risks, benefits and other options for treatment, the patient has consented to  Procedure(s): BARTHOLIN CYST MARSUPIALIZATION (N/A) as a surgical intervention .  The patient's history has been reviewed, patient examined, no change in status, stable for surgery.  I have reviewed the patient's chart and labs.  Questions were answered to the patient's satisfaction.     Levin Dagostino H

## 2013-08-18 NOTE — H&P (View-Only) (Signed)
Subjective:     Patient ID: [name], female   DOB: September 09, 1966, 47 y.o.   MRN: [id number]  HPI Comments: Pt here with recurrent bartholins abcess.  Pt had been dealing with this for over 2y.  She states that it seems to get larger with her menses.  Pt had an attempted I&D with ward catheter placement in the past but was found to have many loculations that limited placement of the catheter.  She reports that this episode began a few days ago and has continued to increase in size so that she cannot sit well.    Review of Systems  Constitutional: Negative for fever, chills and fatigue.  Genitourinary: Positive for vaginal pain (labial) and dyspareunia. Negative for vaginal bleeding, vaginal discharge and menstrual problem.       Objective:   Physical Exam  Constitutional: She appears well-developed and well-nourished.  Cardiovascular: Normal rate, regular rhythm and normal heart sounds.   Pulmonary/Chest: Effort normal and breath sounds normal. No respiratory distress. She has no wheezes. She has no rales.  Abdominal: Soft. She exhibits no distension. There is no tenderness. There is no rebound.  Genitourinary:    There is tenderness and lesion on the right labia. There is no rash on the right labia. There is no rash or lesion on the left labia.  Unable to pass speculum or bimanual exam due to pt discomfort  Lymphadenopathy:       Right: No inguinal adenopathy present.       Left: No inguinal adenopathy present.       Assessment:     Recurrent bartholins cyst      Plan:     Will marsupialize in OR in am Risks and benefits discussed Procedure explained to pt at length, questions addressed rx for dilaudid given for post-op      67m spent discussing treatment of bartholins cyst and pre and postop instructions given Pt scheduled for 8:45am 11/26 To call if cyst ruptures over night

## 2013-08-18 NOTE — Anesthesia Postprocedure Evaluation (Signed)
  Anesthesia Post Note  Patient: [name]  Procedure(s) Performed: Procedure(s) (LRB): BARTHOLIN CYST MARSUPIALIZATION (Right)  Anesthesia type: GA  Patient location: PACU  Post pain: Pain level controlled  Post assessment: Post-op Vital signs reviewed  Last Vitals:  Filed Vitals:   08/18/13 0956  BP: 119/70  Pulse: 90  Temp: 36.8 C  Resp: 20    Post vital signs: Reviewed  Level of consciousness: sedated  Complications: No apparent anesthesia complications

## 2013-08-18 NOTE — Transfer of Care (Signed)
Immediate Anesthesia Transfer of Care Note  Patient: [name]  Procedure(s) Performed: Procedure(s): BARTHOLIN CYST MARSUPIALIZATION (Right)  Patient Location: PACU  Anesthesia Type:General  Level of Consciousness: awake, alert , oriented and patient cooperative  Airway & Oxygen Therapy: Patient Spontanous Breathing and Patient connected to nasal cannula oxygen  Post-op Assessment: Report given to PACU RN and Post -op Vital signs reviewed and stable  Post vital signs: Reviewed and stable  Complications: No apparent anesthesia complications

## 2013-08-18 NOTE — Anesthesia Preprocedure Evaluation (Signed)
Anesthesia Evaluation  Patient identified by MRN, date of birth, ID band Patient awake    Reviewed: Allergy & Precautions, H&P , Patient's Chart, lab work & pertinent test results, reviewed documented beta blocker date and time   History of Anesthesia Complications Negative for: history of anesthetic complications  Airway Mallampati: II TM Distance: >3 FB Neck ROM: full    Dental   Pulmonary former smoker,  breath sounds clear to auscultation        Cardiovascular Exercise Tolerance: Good Rhythm:regular Rate:Normal     Neuro/Psych  Headaches, negative psych ROS   GI/Hepatic   Endo/Other    Renal/GU      Musculoskeletal   Abdominal   Peds  Hematology   Anesthesia Other Findings   Reproductive/Obstetrics                           Anesthesia Physical Anesthesia Plan  ASA: II  Anesthesia Plan: General LMA   Post-op Pain Management:    Induction:   Airway Management Planned:   Additional Equipment:   Intra-op Plan:   Post-operative Plan:   Informed Consent: I have reviewed the patients History and Physical, chart, labs and discussed the procedure including the risks, benefits and alternatives for the proposed anesthesia with the patient or authorized representative who has indicated his/her understanding and acceptance.   Dental Advisory Given  Plan Discussed with: CRNA, Surgeon and Anesthesiologist  Anesthesia Plan Comments:         Anesthesia Quick Evaluation

## 2013-08-18 NOTE — Op Note (Signed)
[name], [name]              ACCOUNT NO.:  [id number]  MEDICAL RECORD NO.:  [id number]  LOCATION:  [facility]                          FACILITY:  [facility]  PHYSICIAN:  Ivor Costa. Farrel Gobble, M.D. DATE OF BIRTH:  [birth year]  DATE OF PROCEDURE:  08/18/2013 DATE OF DISCHARGE:  08/18/2013                              OPERATIVE REPORT   PREOPERATIVE DIAGNOSIS:  Recurrent Bartholin cyst.  POSTOPERATIVE DIAGNOSIS:  Recurrent Bartholin cyst.  PROCEDURE:  Marsupialization.  ANESTHESIA:  MAC.  IV FLUIDS:  1200 mL lactated Ringer's.  URINE OUTPUT:  50 mL.  ESTIMATED BLOOD LOSS:  Approximately 25 mL.  INDICATIONS:  The patient is a 47 year old with a recurrent Bartholin cyst over a number of years, who now presented to the office yesterday with acute exacerbation and pain, who now to the OR for marsupialization.  FINDINGS:  The right labia majora was enlarged, consistent with a Bartholin cyst, approximately 6 x 4 cm with extension towards perineum. The cyst walls were noted to be smooth and no gross defects.  There is a questionable fullness versus inflammation post procedure that was noted that was unable to be drained.  DESCRIPTION OF PROCEDURE:  The patient was taken to the operating room, placed in dorsal lithotomy position, prepped and draped in usual sterile fashion.  Two Allis clamps were placed on the right hymen which was everted from the vagina.  Marking pen was then used to mark the incision.  Two areas of prior scarring were noted and were noted to be superficial.  An incision was then made with a scalpel, stabilizing the cyst externally and the cyst wall was punctured.  The incision was extended small amount superiorly and inferiorly.  The cyst was massaged and a copious amount of purulent appearing material was obtained, there was no malodor.  The labia were massaged and the Bartholin cyst was noted to be what was felt to be entirely decompressed.  The cyst wall was then  plicated to the vaginal mucosa for later identification in serial spots.  The Mosquitos were then advanced through the opening and the gland was swept superiorly, inferiorly, and laterally, and all adhesions were broken, and examining Pinky was placed through the incision and the cyst wall appeared smooth.  There was only notable for a slight amount of edema.  The cyst wall was then plicated to the vaginal mucosa in a circumferential fashion with 3-0 Vicryl.  The cyst was irrigated with saline and was noted to be unremarkable.  The examination at the end, however, it was a firmness that was palpated, but was not palpable from the inside of the cyst.  The mass was held again with 2 fingers.  Scalpel was then advanced.  The mass felt to be punctured, however, no debris was seen.  We attempted to do fine-needle aspiration, but there was no instrument available at the time of the procedure, and we elected to end the procedure at this point.  If the mass persists and is not just localized edema, we will go ahead and refer her for further evaluation.  The vaginal mucosa was then injected with 2% lidocaine for postoperative comfort.  Lap, sponge, needle counts were correct x2.  She was given cefotetan intraoperatively, and a small portion of the cyst wall was sent for Pathology.     Ivor Costa. Farrel Gobble, M.D.     THL/MEDQ  D:  08/18/2013  T:  08/18/2013  Job:  [id number]

## 2013-08-18 NOTE — Brief Op Note (Signed)
08/18/2013  10:10 AM  PATIENT:  Talyia L Brookens  47 y.o. female  PRE-OPERATIVE DIAGNOSIS:  recurrent bartholin cyst  POST-OPERATIVE DIAGNOSIS:  recurrent bartholin cyst  PROCEDURE:  Procedure(s): BARTHOLIN CYST MARSUPIALIZATION (Right)  SURGEON:  Surgeon(s) and Role:    * Bennye Alm, MD - Primary  PHYSICIAN ASSISTANT:   ASSISTANTS: none   ANESTHESIA:   MAC  EBL:  Total I/O In: 1200 [I.V.:1200] Out: 75 [Urine:50; Blood:25]  BLOOD ADMINISTERED:none  DRAINS: none   LOCAL MEDICATIONS USED:  2% LIDOCAINE  and Amount: 5 ml  SPECIMEN:  Source of Specimen:  bartholins  DISPOSITION OF SPECIMEN:  PATHOLOGY  COUNTS:  YES  TOURNIQUET:  * No tourniquets in log *  DICTATION: .Other Dictation: Dictation Number [id number]  PLAN OF CARE: Discharge to home after PACU  PATIENT DISPOSITION:  PACU - hemodynamically stable.   Delay start of Pharmacological VTE agent (>24hrs) due to surgical blood loss or risk of bleeding: not applicable

## 2013-08-19 ENCOUNTER — Encounter (HOSPITAL_COMMUNITY): Payer: Self-pay | Admitting: Gynecology

## 2013-08-31 ENCOUNTER — Telehealth: Payer: Self-pay | Admitting: *Deleted

## 2013-08-31 NOTE — Telephone Encounter (Signed)
Patient notified and will review further at post op visit.  Routing to provider for final review. Patient agreeable to disposition. Will close encounter

## 2013-08-31 NOTE — Telephone Encounter (Signed)
Message copied by Alisa Graff on Tue Aug 31, 2013 11:13 AM ------      Message from: Douglass Rivers      Created: Fri Aug 27, 2013 10:22 AM       Inform no malignancy in specimen ------

## 2013-09-01 ENCOUNTER — Telehealth: Payer: Self-pay | Admitting: Gynecology

## 2013-09-01 ENCOUNTER — Ambulatory Visit (INDEPENDENT_AMBULATORY_CARE_PROVIDER_SITE_OTHER): Payer: BC Managed Care – PPO | Admitting: Gynecology

## 2013-09-01 VITALS — BP 100/60 | HR 66 | Resp 14 | Ht 62.0 in | Wt 141.0 lb

## 2013-09-01 DIAGNOSIS — Z9889 Other specified postprocedural states: Secondary | ICD-10-CM

## 2013-09-01 DIAGNOSIS — N750 Cyst of Bartholin's gland: Secondary | ICD-10-CM

## 2013-09-01 MED ORDER — LIDOCAINE HCL 2 % EX GEL: 1.0000 "application " | CUTANEOUS | Status: DC | PRN

## 2013-09-01 NOTE — Telephone Encounter (Signed)
Call to patient and scheduled for 09-29-13 at 4pm. Patient agreeable.  Routing to provider for final review. Patient agreeable to disposition. Will close encounter

## 2013-09-01 NOTE — Telephone Encounter (Signed)
Patient needs her 3 week post op reck. I offered the patient 09/27/13 @ 11:00. Patient needs an appointment after 3:00 if possible.

## 2013-09-03 ENCOUNTER — Encounter: Payer: Self-pay | Admitting: Gynecology

## 2013-09-03 NOTE — Progress Notes (Signed)
Subjective:     Patient ID: [name], female   DOB: Sep 19, 1966, 47 y.o.   MRN: [id number]  HPI Comments: Pt here for post-op visit, s/p bartholin's marsupilaization.  Pt states that other than  Migraine immediately post-op, she has continued t feel better each day.  She denies any bleeding or tenderness from the area but does feel some fullness that she related maybe to the surtures    Review of Systems  Constitutional: Negative for fever and chills.  Genitourinary: Positive for vaginal discharge. Negative for vaginal bleeding and vaginal pain.       Objective:   Physical Exam  Constitutional: She is oriented to person, place, and time. She appears well-developed and well-nourished.  Genitourinary:     Neurological: She is alert and oriented to person, place, and time.  Skin: Skin is warm and dry.       Assessment:     Post-op doing well     Plan:     Assured findings normal, discharge related to repair, no evidnece of infection con't pelvic rest, sitz baths and no tampons this upcoming cycle rto 3w

## 2013-09-27 ENCOUNTER — Encounter: Payer: Self-pay | Admitting: Gynecology

## 2013-09-27 ENCOUNTER — Ambulatory Visit (INDEPENDENT_AMBULATORY_CARE_PROVIDER_SITE_OTHER): Payer: BC Managed Care – PPO | Admitting: Gynecology

## 2013-09-27 VITALS — BP 110/68 | HR 70 | Resp 14 | Ht 62.0 in | Wt 146.0 lb

## 2013-09-27 DIAGNOSIS — Z9889 Other specified postprocedural states: Secondary | ICD-10-CM

## 2013-09-27 DIAGNOSIS — N750 Cyst of Bartholin's gland: Secondary | ICD-10-CM

## 2013-09-27 NOTE — Progress Notes (Signed)
Subjective:     Patient ID: [name], female   DOB: [birth year], 48 y.o.   MRN: [id number]  HPI Comments: 6w s/p marsupialization of bartholins for f/u.  Pt is without complaints, no tenderness or fullness or area.  She has not been sexually active nor used tampons since surgery.    Review of Systems  Genitourinary: Negative for vaginal bleeding, vaginal discharge and vaginal pain.       Objective:   Physical Exam  Constitutional: She is oriented to person, place, and time. She appears well-developed and well-nourished.  Genitourinary:    Cervix exhibits no discharge.  Incision healed, nontender  Lymphadenopathy:       Right: No inguinal adenopathy present.       Left: No inguinal adenopathy present.  Neurological: She is alert and oriented to person, place, and time.       Assessment:     Post-op doing well     Plan:     May resume usual activity F/u prn

## 2013-09-29 ENCOUNTER — Ambulatory Visit: Payer: BC Managed Care – PPO | Admitting: Gynecology

## 2013-10-15 ENCOUNTER — Telehealth: Payer: Self-pay | Admitting: Gynecology

## 2013-10-15 ENCOUNTER — Ambulatory Visit (INDEPENDENT_AMBULATORY_CARE_PROVIDER_SITE_OTHER): Payer: BC Managed Care – PPO | Admitting: Gynecology

## 2013-10-15 ENCOUNTER — Encounter: Payer: Self-pay | Admitting: Gynecology

## 2013-10-15 VITALS — BP 127/77 | HR 81 | Resp 18 | Ht 62.0 in | Wt 143.4 lb

## 2013-10-15 DIAGNOSIS — L089 Local infection of the skin and subcutaneous tissue, unspecified: Secondary | ICD-10-CM

## 2013-10-15 MED ORDER — FLUCONAZOLE 150 MG PO TABS: 150.0000 mg | ORAL_TABLET | Freq: Once | ORAL | Status: DC

## 2013-10-15 MED ORDER — DOXYCYCLINE HYCLATE 100 MG PO CAPS: 100.0000 mg | ORAL_CAPSULE | Freq: Two times a day (BID) | ORAL | Status: DC

## 2013-10-15 NOTE — Telephone Encounter (Signed)
Pt just had surgery for batholins cyst but now has some coming up on the other side.

## 2013-10-15 NOTE — Patient Instructions (Signed)
Keep area dry Avoid irritation Warm compress Doxycyline Diflucan as needed

## 2013-10-15 NOTE — Progress Notes (Signed)
Subjective:     Patient ID: [name], female   DOB: 1966-08-28, 48 y.o.   MRN: [id number]  HPI Comments: Pt called this am reporting a bartholins cyst on her left.  Pt had recently had a marsupialization on her right after recurrent Bartholin's cysts.  Pt does have a history of bartholin's on her left. Pt has tried sitz baths for 10 days with minimal affect.  She denies fever, chills. Pt states area is rubbing on underwear and she got more aggressive and has noticed some on her underwear.  She used to get them with her menses which is now late.    Review of Systems  Constitutional: Negative for fever and chills.       Objective:   Physical Exam  Constitutional: She is oriented to person, place, and time. She appears well-developed and well-nourished.  Genitourinary:    There is no rash, tenderness, lesion or injury on the right labia. There is no rash, tenderness, lesion or injury on the left labia.  Neurological: She is alert and oriented to person, place, and time.  Skin: Skin is warm and dry.  no bartholin's cyst on left, right well healed     Assessment:     Skin infection     Plan:     Keep area dry Avoid irritation Warm compress Doxycyline Diflucan as needed

## 2013-10-15 NOTE — Telephone Encounter (Signed)
Patient needs a two recheck appointment due  10/29/13, No appointments available until 11/05/13.

## 2013-10-15 NOTE — Telephone Encounter (Signed)
Appointment scheduled, Dr. Charlies Constable okay with time.  Routing to provider for final review. Patient agreeable to disposition. Will close encounter

## 2013-10-15 NOTE — Telephone Encounter (Signed)
Scheduled for 11-05-13 at 230, appt entered, please notify patient of appointment. Ok to wait till 11-05-13.  Route to provider and close once patient notified.

## 2013-11-04 ENCOUNTER — Telehealth: Payer: Self-pay | Admitting: Gynecology

## 2013-11-04 NOTE — Telephone Encounter (Signed)
Pt  wants to talk with nurse about her appointment for tomorrow she thinks she may not need this appt

## 2013-11-04 NOTE — Telephone Encounter (Signed)
Spoke with patient. She feels that bump has improved and feels much better. She does not feel that she needs the follow up appointment as scheduled for tomorrow. Advised I would cancel and she can follow up prn. Patient agreeable.  Routing to provider for final review. Patient agreeable to disposition. Will close encounter

## 2013-11-05 ENCOUNTER — Ambulatory Visit: Payer: BC Managed Care – PPO | Admitting: Gynecology

## 2014-03-16 ENCOUNTER — Other Ambulatory Visit: Payer: Self-pay | Admitting: Family Medicine

## 2014-03-16 DIAGNOSIS — R1011 Right upper quadrant pain: Secondary | ICD-10-CM

## 2014-03-21 ENCOUNTER — Ambulatory Visit
Admission: RE | Admit: 2014-03-21 | Discharge: 2014-03-21 | Disposition: A | Discharge status: Home / Self Care | Payer: BC Managed Care – PPO | Source: Ambulatory Visit | Attending: Family Medicine | Admitting: Family Medicine

## 2014-03-21 DIAGNOSIS — R1011 Right upper quadrant pain: Secondary | ICD-10-CM

## 2014-04-18 ENCOUNTER — Ambulatory Visit (INDEPENDENT_AMBULATORY_CARE_PROVIDER_SITE_OTHER): Payer: BC Managed Care – PPO | Admitting: Licensed Clinical Social Worker

## 2014-04-18 DIAGNOSIS — F4322 Adjustment disorder with anxiety: Secondary | ICD-10-CM

## 2014-05-03 ENCOUNTER — Other Ambulatory Visit: Payer: Self-pay

## 2014-05-03 DIAGNOSIS — Z1231 Encounter for screening mammogram for malignant neoplasm of breast: Secondary | ICD-10-CM

## 2014-05-11 ENCOUNTER — Ambulatory Visit
Admission: RE | Admit: 2014-05-11 | Discharge: 2014-05-11 | Disposition: A | Discharge status: Home / Self Care | Payer: BC Managed Care – PPO | Source: Ambulatory Visit

## 2014-05-11 DIAGNOSIS — Z1231 Encounter for screening mammogram for malignant neoplasm of breast: Secondary | ICD-10-CM

## 2014-05-17 ENCOUNTER — Other Ambulatory Visit: Payer: Self-pay | Admitting: Gynecology

## 2014-05-17 DIAGNOSIS — R928 Other abnormal and inconclusive findings on diagnostic imaging of breast: Secondary | ICD-10-CM

## 2014-05-26 ENCOUNTER — Ambulatory Visit
Admission: RE | Admit: 2014-05-26 | Discharge: 2014-05-26 | Disposition: A | Discharge status: Home / Self Care | Payer: BC Managed Care – PPO | Source: Ambulatory Visit | Attending: Gynecology | Admitting: Gynecology

## 2014-05-26 DIAGNOSIS — R928 Other abnormal and inconclusive findings on diagnostic imaging of breast: Secondary | ICD-10-CM

## 2014-06-29 ENCOUNTER — Encounter: Payer: Self-pay | Admitting: Nurse Practitioner

## 2014-06-29 ENCOUNTER — Ambulatory Visit (INDEPENDENT_AMBULATORY_CARE_PROVIDER_SITE_OTHER): Payer: BC Managed Care – PPO | Admitting: Nurse Practitioner

## 2014-06-29 VITALS — BP 110/68 | HR 76 | Ht 62.25 in | Wt 137.0 lb

## 2014-06-29 DIAGNOSIS — Z01419 Encounter for gynecological examination (general) (routine) without abnormal findings: Secondary | ICD-10-CM

## 2014-06-29 DIAGNOSIS — Z0000 Encounter for general adult medical examination without abnormal findings: Secondary | ICD-10-CM

## 2014-06-29 DIAGNOSIS — N912 Amenorrhea, unspecified: Secondary | ICD-10-CM

## 2014-06-29 DIAGNOSIS — Z1211 Encounter for screening for malignant neoplasm of colon: Secondary | ICD-10-CM

## 2014-06-29 LAB — POCT URINALYSIS DIPSTICK
Bilirubin, UA: NEGATIVE
Blood, UA: NEGATIVE
Glucose, UA: NEGATIVE
Ketones, UA: NEGATIVE
Leukocytes, UA: NEGATIVE
Nitrite, UA: NEGATIVE
PROTEIN UA: NEGATIVE
UROBILINOGEN UA: NEGATIVE
pH, UA: 7

## 2014-06-29 LAB — LIPID PANEL
Cholesterol: 185 mg/dL (ref 0–200)
HDL: 55 mg/dL (ref >39)
LDL CALC: 94 mg/dL (ref 0–99)
Total CHOL/HDL Ratio: 3.4 Ratio
Triglycerides: 181 mg/dL — ABNORMAL HIGH (ref <150)
VLDL: 36 mg/dL (ref 0–40)

## 2014-06-29 LAB — COMPREHENSIVE METABOLIC PANEL
ALT: 12 U/L (ref 0–35)
AST: 14 U/L (ref 0–37)
Albumin: 4.5 g/dL (ref 3.5–5.2)
Alkaline Phosphatase: 45 U/L (ref 39–117)
BILIRUBIN TOTAL: 0.3 mg/dL (ref 0.2–1.2)
BUN: 15 mg/dL (ref 6–23)
CO2: 31 mEq/L (ref 19–32)
Calcium: 9.8 mg/dL (ref 8.4–10.5)
Chloride: 99 mEq/L (ref 96–112)
Creat: 0.72 mg/dL (ref 0.50–1.10)
Glucose, Bld: 82 mg/dL (ref 70–99)
Potassium: 4.2 mEq/L (ref 3.5–5.3)
Sodium: 139 mEq/L (ref 135–145)
Total Protein: 6.8 g/dL (ref 6.0–8.3)

## 2014-06-29 LAB — TSH: TSH: 0.797 u[IU]/mL (ref 0.350–4.500)

## 2014-06-29 LAB — HEMOGLOBIN, FINGERSTICK: HEMOGLOBIN, FINGERSTICK: 13.5 g/dL (ref 12.0–16.0)

## 2014-06-29 MED ORDER — MEDROXYPROGESTERONE ACETATE 10 MG PO TABS: 10.0000 mg | ORAL_TABLET | Freq: Every day | ORAL | Status: DC

## 2014-06-29 NOTE — Patient Instructions (Addendum)

## 2014-06-29 NOTE — Progress Notes (Signed)
Patient ID: [name], female   DOB: 03/17/1966, 48 y.o.   MRN: [id number] 48 y.o. G0P0 Married Caucasian Fe here for annual exam.  Menses is more erratic.  Some vaso symptoms that are tolerable.  Wants to take Provera.   Patient's last menstrual period was 04/29/2014.          Sexually active: yes  The current method of family planning is none.  Exercising: no The patient does not participate in regular exercise at present.  Smoker: no   Health Maintenance:  Pap: 06/25/12, WNL, neg HR HPV  MMG: 05/11/14 with diagnostic 05/26/14, BI-Rads 1: negative Colonoscopy:  Never  BMD:  Never  TDaP: 06/25/12  Labs:  HB:  13.5  Urine:  Negative    reports that she quit smoking about 2 years ago. She has never used smokeless tobacco. She reports that she does not drink alcohol or use illicit drugs.  Past Medical History  Diagnosis Date  . Dysmenorrhea   . Migraines   . Vaginal cyst     Past Surgical History  Procedure Laterality Date  . Tympanostomy tube placement    . Pilonidal cyst excision  6/97  . Hemorrhoid surgery  3/99 & 7/03    X 2  . Cervical discectomy  12/2004    C 5-6  . Bartholin cyst marsupialization Right 08/18/2013    Procedure: BARTHOLIN CYST MARSUPIALIZATION;  Surgeon: Azalia Bilis, MD;  Location: [facility];  Service: Gynecology;  Laterality: Right;    Current Outpatient Prescriptions  Medication Sig Dispense Refill  . celecoxib (CELEBREX) 200 MG capsule Take 200 mg by mouth as needed.       . Multiple Vitamins-Calcium (ONE-A-DAY WOMENS PO) Take by mouth.       No current facility-administered medications for this visit.    Family History  Problem Relation Age of Onset  . Diabetes Mother   . Aneurysm Father     abdominal aneurysm  . Aneurysm Paternal Uncle     abdominal aneurysm  . Diabetes Maternal Grandmother   . Diabetes Maternal Grandfather   . Stroke Maternal Grandfather   . Hypertension Maternal Grandfather     ROS:  Pertinent items are noted in  HPI.  Otherwise, a comprehensive ROS was negative.  Exam:   BP 110/68  Pulse 76  Ht 5' 2.25" (1.581 m)  Wt 137 lb (62.143 kg)  BMI 24.86 kg/m2  LMP 04/29/2014 Height: 5' 2.25" (158.1 cm)  Ht Readings from Last 3 Encounters:  06/29/14 5' 2.25" (1.581 m)  10/15/13 5\' 2"  (1.575 m)  09/27/13 5\' 2"  (1.575 m)    General appearance: alert, cooperative and appears stated age Head: Normocephalic, without obvious abnormality, atraumatic Neck: no adenopathy, supple, symmetrical, trachea midline and thyroid normal to inspection and palpation Lungs: clear to auscultation bilaterally Breasts: normal appearance, no masses or tenderness Heart: regular rate and rhythm Abdomen: soft, non-tender; no masses,  no organomegaly Extremities: extremities normal, atraumatic, no cyanosis or edema Skin: Skin color, texture, turgor normal. No rashes or lesions Lymph nodes: Cervical, supraclavicular, and axillary nodes normal. No abnormal inguinal nodes palpated Neurologic: Grossly normal   Pelvic: External genitalia:  no lesions              Urethra:  normal appearing urethra with no masses, tenderness or lesions              Bartholin's and Skene's: normal  Vagina: normal appearing vagina with normal color and discharge, no lesions              Cervix: anteverted              Pap taken: No. Bimanual Exam:  Uterus:  normal size, contour, position, consistency, mobility, non-tender              Adnexa: no mass, fullness, tenderness               Rectovaginal: Confirms               Anus:  normal sphincter tone, no lesions  A:  Well Woman with normal exam  Irregular menses c/w perimenopause  Current amenorrhea X 2 months  Female relationship   History of right Bartholin gland cyst with I & D 08/2012     P:   Reviewed health and wellness pertinent to exam  Pap smear not taken today  Mammogram is due 8/16  Will check [place] and labs  Will do Provera Challenge and call back with  response  Referral for colonoscopy with Dr. Juliette Alcide on breast self exam, mammography screening, adequate intake of calcium and vitamin D, diet and exercise, Kegel's exercises return annually or prn  An After Visit Summary was printed and given to the patient.

## 2014-06-30 LAB — FOLLICLE STIMULATING HORMONE: FSH: 69.7 m[IU]/mL

## 2014-06-30 LAB — VITAMIN D 25 HYDROXY (VIT D DEFICIENCY, FRACTURES): Vit D, 25-Hydroxy: 57 ng/mL (ref 30–89)

## 2014-07-03 NOTE — Progress Notes (Signed)
Encounter reviewed by Dr. Brook Silva.  

## 2014-07-04 ENCOUNTER — Telehealth: Payer: Self-pay | Admitting: Obstetrics and Gynecology

## 2014-07-04 NOTE — Telephone Encounter (Signed)
Pt was seen on 06/29/14 and did not have a pap done. She states when she checked her MY CHART it says she had a pap done.

## 2014-07-04 NOTE — Telephone Encounter (Signed)
Advised patient she is correct that she did not have pap smear. I am unsure why mychart is stating that she did. Advised would discuss with Milford Cage, FNP Medical assistance to see if she can update Health Maintenance. Patient agreeable.   Colletta [place], can you update the Health maintenance?

## 2014-07-07 NOTE — Telephone Encounter (Signed)
Message left for patient to return call.

## 2014-07-07 NOTE — Telephone Encounter (Signed)
I have reviewed chart and no pap was done in 2015 or 2014 although Health Maintenance shows a pap as done. A help desk ticket has been corrected to look into this.

## 2014-07-08 NOTE — Telephone Encounter (Signed)
Pt notified there has been a glitch in the system and IT is aware.  Advised pt that we are keeping an eye on things to make sure this is corrected, but if she does not see this corrected in a couple of weeks to call us back.  Pt agreeable and appreciative of follow up call.

## 2014-08-31 ENCOUNTER — Telehealth: Payer: Self-pay | Admitting: Emergency Medicine

## 2014-08-31 NOTE — Telephone Encounter (Signed)
-----   Message from Elveria Rising, MD sent at 07/04/2014  8:13 AM EDT ----- Regarding: RE: Mammogram hold  yes ----- Message -----    From: Karen Chafe, RN    Sent: 06/07/2014   4:59 PM      To: Azalia Bilis, MD Subject: Mammogram hold                                 Okay to remove from hold?

## 2014-12-26 ENCOUNTER — Ambulatory Visit (INDEPENDENT_AMBULATORY_CARE_PROVIDER_SITE_OTHER): Payer: BLUE CROSS/BLUE SHIELD | Admitting: Obstetrics & Gynecology

## 2014-12-26 ENCOUNTER — Telehealth: Payer: Self-pay | Admitting: Nurse Practitioner

## 2014-12-26 VITALS — BP 112/66 | HR 80 | Temp 98.9°F | Ht 62.25 in | Wt 142.0 lb

## 2014-12-26 DIAGNOSIS — N750 Cyst of Bartholin's gland: Secondary | ICD-10-CM | POA: Diagnosis not present

## 2014-12-26 MED ORDER — FLUCONAZOLE 150 MG PO TABS: 150.0000 mg | ORAL_TABLET | Freq: Once | ORAL | Status: DC

## 2014-12-26 MED ORDER — CEPHALEXIN 500 MG PO CAPS: 500.0000 mg | ORAL_CAPSULE | Freq: Four times a day (QID) | ORAL | Status: DC

## 2014-12-26 NOTE — Telephone Encounter (Signed)
Pt says she feels like she has another cyst in the same place as before.

## 2014-12-26 NOTE — Telephone Encounter (Signed)
Spoke with patient. Patient had marsupialization of bartholin cyst with Dr.Lathrop 08/18/2013. Patient started her cycle Friday 4/1. "I immediately noticed that when I used a tampon I felt really irritated. Now I have a knot that is really uncomfortable and painful to sit. I do not want to wait until it gets so bad that I have to have surgery like before." Appointment scheduled for today at 1:45pm with Dr.Miller. Patient is agreeable to date and time.  Routing to provider for final review. Patient agreeable to disposition. Will close encounter

## 2014-12-26 NOTE — Progress Notes (Signed)
Subjective:     Patient ID: KAYDYNCE PAT, female   DOB: December 13, 1965, 49 y.o.   MRN: [id number]  HPI 49 yo G0 MWF here with complaints of left labial pain that started over the weekend.  Pt has long hx of recurrent vulvar Bartholin's cyst/abscess that was I&D'ed several times.  Pt finally has marsupialization of the gland with Dr. Charlies Constable in 11/14 and has not had any trouble since then.  It is very tender but there has been no drainage or bleeding.  Pt used tampons over the weekend, which she usually doesn't, and felt this was part of the cause of the problem.  No fever.  No body aches.  No chills.  Review of Systems  All other systems reviewed and are negative.      Objective:   Physical Exam  Constitutional: She appears well-developed and well-nourished.  Genitourinary: Vagina normal.    There is no rash, lesion or injury on the right labia. There is no rash, tenderness, lesion or injury on the left labia.  Lymphadenopathy:       Right: No inguinal adenopathy present.       Left: No inguinal adenopathy present.  Skin: Skin is warm and dry.  Psychiatric: She has a normal mood and affect.       Assessment:     H/O recurrent Bartholin's abscess/cyst Very small recurrent right cyst today H/o marsupialization 08/18/13 with Dr. Charlies Constable    Plan:     Keflex 500mg  qid x 7 days Diflucan 150mg  po x 1, repeat 72 hrs  Pt to give update in 48-72 hours.  Of course, she knows to return if continues to worsen.  Lesion is very small today and I do not feel attempt at I&D is appropriate.

## 2014-12-30 ENCOUNTER — Telehealth: Payer: Self-pay | Admitting: Obstetrics & Gynecology

## 2014-12-30 ENCOUNTER — Ambulatory Visit: Payer: BLUE CROSS/BLUE SHIELD | Admitting: Obstetrics & Gynecology

## 2014-12-30 NOTE — Telephone Encounter (Signed)
Spoke with patient. Patient was seen 4/4 with Dr.Miller for a vaginal cyst. Started on Keflex 500mg  qid for 7 days. "It is not going away at all. 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 It is just so uncomfortable. It has not changed at all since I saw her. I just feel so raw." States removal of gland was discussed with Dr.Miller at 4/4 appointment. "I do not know if I need to come back in to see her or if she wants to remove it now." Advised patient will speak with Dr.Miller and return call with further recommendations. Patient is agreeable.

## 2014-12-30 NOTE — Telephone Encounter (Signed)
Spoke with patient. Patient states "I think that this cyst ruptured. I feel so much relief and the pressure is gone. I felt a gush and now it feels okay. I just do not want to bother Dr.Miller if I do not have to." Advised patient if she is feeling better to continue with her antibiotics and monitor the area. If symptoms return will need to be seen over the weekend or beginning of next week. Patient is agreeable. Will monitor and call with update on Monday morning.  Routing to provider for final review. Patient agreeable to disposition. Will close encounter

## 2014-12-30 NOTE — Telephone Encounter (Signed)
Patient thinks she may not need this apointment today. Please call her asap

## 2014-12-30 NOTE — Telephone Encounter (Signed)
Spoke with patient. Advised I have spoken with Dr.Miller who would be happy to see patient in office today for evaluation. Patient is agreeable. Appointment scheduled for today at 3pm with Dr.Miller. Patient is agreeable.  Routing to provider for final review. Patient agreeable to disposition. Will close encounter

## 2014-12-30 NOTE — Telephone Encounter (Signed)
Patient calling requesting an appointment with Dr. Sabra Heck. She says the medicine has not made her problem go away. She was seen recently for a painful cyst.

## 2015-01-01 ENCOUNTER — Encounter: Payer: Self-pay | Admitting: Obstetrics & Gynecology

## 2015-02-23 ENCOUNTER — Telehealth: Payer: Self-pay | Admitting: Nurse Practitioner

## 2015-02-23 NOTE — Telephone Encounter (Signed)
Spoke with patient. Patient states that she has started to have night sweats that are keeping her awake and hot flashes during the day. Patient was seen on 06/29/2014 for aex with Milford Cage, FNP. FSH was drawn at that appointment which was 69.7. Patient would like to know what she needs to do for treatment of symptoms. Advised will need to speak with Milford Cage, FNP regarding further recommendations and return call. Patient is agreeable.  Milford Cage, FNP okay for patient to return for lab visit to check [facility] level?

## 2015-02-23 NOTE — Telephone Encounter (Signed)
Pt believes she is experiencing menopause symptoms and wanted to discuss treatment options.

## 2015-02-24 NOTE — Telephone Encounter (Signed)
[place] at last visit was menopausal level, she needs an OV with Patty to discuss HRT and management of symptoms.

## 2015-02-24 NOTE — Telephone Encounter (Signed)
Spoke with patient. Advised of message as seen below from [place]. Patient is agreeable. Appointment scheduled for 6/9 at 4:15pm with Milford Cage, [place]. Patient is agreeable to date and time.  Routing to provider for final review. Patient agreeable to disposition. Will close encounter.

## 2015-03-02 ENCOUNTER — Ambulatory Visit (INDEPENDENT_AMBULATORY_CARE_PROVIDER_SITE_OTHER): Payer: BLUE CROSS/BLUE SHIELD | Admitting: Nurse Practitioner

## 2015-03-02 ENCOUNTER — Encounter: Payer: Self-pay | Admitting: Nurse Practitioner

## 2015-03-02 VITALS — BP 98/60 | HR 70 | Ht 62.25 in | Wt 139.4 lb

## 2015-03-02 DIAGNOSIS — N951 Menopausal and female climacteric states: Secondary | ICD-10-CM

## 2015-03-02 MED ORDER — MEDROXYPROGESTERONE ACETATE 10 MG PO TABS: 10.0000 mg | ORAL_TABLET | Freq: Every day | ORAL | Status: DC

## 2015-03-02 NOTE — Progress Notes (Signed)
Patient ID: CHRISHONDA HESCH, female   DOB: 1966-06-06, 49 y.o.   MRN: [id number] S: This 49 yo WM Fe  G0 comes in today for a consult visit to discuss perimenopausal symptoms.  She has noted an increase in vaso symptoms.  Some symptoms are worse at night with associated insomnia.   LMP last week in April.    She does not necessarily feel that menses is about to start but would feel better is she had a cycle.  She has taken Provera challenge in the past and wants to take this again.  She is not sure if she ever wants HRT.  She is hopeful that Provera will work as it did in the past.  Her partner - female is also having irregular and erratic menses.  Denies vaginal dryness.    A: Perimenopausal symptoms  Current amenorrhea X about 40 days  Plan: Will do FSH, TSH, estradiol level and follow  She is given Provera 10 mg X 10 and OTC Estroven  She is to call back with challenge to Provera challenge  Discussion about [facility] study with +/- benefits and risk of HRT  She would like to try and avoid these med's.   Consult time:  15 minutes

## 2015-03-02 NOTE — Patient Instructions (Signed)
Menopause Menopause is the normal time of life when menstrual periods stop completely. Menopause is complete when you have missed 12 consecutive menstrual periods. It usually occurs between the ages of 48 years and 55 years. Very rarely does a woman develop menopause before the age of 40 years. At menopause, your ovaries stop producing the female hormones estrogen and progesterone. This can cause undesirable symptoms and also affect your health. Sometimes the symptoms may occur 4-5 years before the menopause begins. There is no relationship between menopause and:  Oral contraceptives.  Number of children you had.  Race.  The age your menstrual periods started (menarche). Heavy smokers and very thin women may develop menopause earlier in life. CAUSES  The ovaries stop producing the female hormones estrogen and progesterone.  Other causes include:  Surgery to remove both ovaries.  The ovaries stop functioning for no known reason.  Tumors of the pituitary gland in the brain.  Medical disease that affects the ovaries and hormone production.  Radiation treatment to the abdomen or pelvis.  Chemotherapy that affects the ovaries. SYMPTOMS   Hot flashes.  Night sweats.  Decrease in sex drive.  Vaginal dryness and thinning of the vagina causing painful intercourse.  Dryness of the skin and developing wrinkles.  Headaches.  Tiredness.  Irritability.  Memory problems.  Weight gain.  Bladder infections.  Hair growth of the face and chest.  Infertility. More serious symptoms include:  Loss of bone (osteoporosis) causing breaks (fractures).  Depression.  Hardening and narrowing of the arteries (atherosclerosis) causing heart attacks and strokes. DIAGNOSIS   When the menstrual periods have stopped for 12 straight months.  Physical exam.  Hormone studies of the blood. TREATMENT  There are many treatment choices and nearly as many questions about them. The  decisions to treat or not to treat menopausal changes is an individual choice made with your health care provider. Your health care provider can discuss the treatments with you. Together, you can decide which treatment will work best for you. Your treatment choices may include:   Hormone therapy (estrogen and progesterone).  Non-hormonal medicines.  Treating the individual symptoms with medicine (for example antidepressants for depression).  Herbal medicines that may help specific symptoms.  Counseling by a psychiatrist or psychologist.  Group therapy.  Lifestyle changes including:  Eating healthy.  Regular exercise.  Limiting caffeine and alcohol.  Stress management and meditation.  No treatment. HOME CARE INSTRUCTIONS   Take the medicine your health care provider gives you as directed.  Get plenty of sleep and rest.  Exercise regularly.  Eat a diet that contains calcium (good for the bones) and soy products (acts like estrogen hormone).  Avoid alcoholic beverages.  Do not smoke.  If you have hot flashes, dress in layers.  Take supplements, calcium, and vitamin D to strengthen bones.  You can use over-the-counter lubricants or moisturizers for vaginal dryness.  Group therapy is sometimes very helpful.  Acupuncture may be helpful in some cases. SEEK MEDICAL CARE IF:   You are not sure you are in menopause.  You are having menopausal symptoms and need advice and treatment.  You are still having menstrual periods after age 55 years.  You have pain with intercourse.  Menopause is complete (no menstrual period for 12 months) and you develop vaginal bleeding.  You need a referral to a specialist (gynecologist, psychiatrist, or psychologist) for treatment. SEEK IMMEDIATE MEDICAL CARE IF:   You have severe depression.  You have excessive vaginal bleeding.    You fell and think you have a broken bone.  You have pain when you urinate.  You develop leg or  chest pain.  You have a fast pounding heart beat (palpitations).  You have severe headaches.  You develop vision problems.  You feel a lump in your breast.  You have abdominal pain or severe indigestion. Document Released: 11/30/2003 Document Revised: 05/12/2013 Document Reviewed: 04/08/2013 [facility] Patient Information 2015 [place], [place]. This information is not intended to replace advice given to you by your health care provider. Make sure you discuss any questions you have with your health care provider.  [place]  Am and pm

## 2015-03-03 LAB — ESTRADIOL: Estradiol: 15.7 pg/mL

## 2015-03-03 LAB — TSH: TSH: 1.18 u[IU]/mL (ref 0.350–4.500)

## 2015-03-03 LAB — FOLLICLE STIMULATING HORMONE: FSH: 124.5 m[IU]/mL — AB

## 2015-03-06 NOTE — Progress Notes (Signed)
Reviewed personally.  M. [name] Rotha Cassels, MD.  

## 2015-03-09 ENCOUNTER — Telehealth: Payer: Self-pay | Admitting: Nurse Practitioner

## 2015-03-09 NOTE — Telephone Encounter (Signed)
Patient calling for recent results.

## 2015-03-09 NOTE — Telephone Encounter (Signed)
Left message to call [place] at [phone].   Notes Recorded by Kem Boroughs, FNP on 03/06/2015 at 8:59 AM Results via my chart:  Jacari, The thyroid test was normal. The estradiol level was low but in the menopausal range. The [facility] was quite high at 124.5 and looks like you are in menopause -however if you get a withdrawal bleeding from the Provera challenge - you still need to call.

## 2015-03-09 NOTE — Telephone Encounter (Signed)
Returned call

## 2015-03-09 NOTE — Telephone Encounter (Signed)
Spoke with patient. Advised of results as seen below from [name], [place]. Patient is agreeable and verbalizes understanding. Patient is going to be traveling out of town for July fourth and is going to start Provera after returning. Advised I will let Nancy Cage, FNP know. Patient will call with results of Provera challenge.  Routing to provider for final review. Patient agreeable to disposition. Will close encounter.

## 2015-03-28 ENCOUNTER — Telehealth: Payer: Self-pay | Admitting: Nurse Practitioner

## 2015-03-28 NOTE — Telephone Encounter (Signed)
Patient calling with questions about Provera.

## 2015-03-28 NOTE — Telephone Encounter (Signed)
Spoke with patient. Patient states that she started Provera today but can not remember how long she needs to monitor for bleeding after she completes it. Will complete on 04/07/2015. Advised will need to monitor until 04/21/2015 and call with positive or negative bleed. Patient is agreeable.  Routing to provider for final review. Patient agreeable to disposition. Will close encounter.

## 2015-04-11 ENCOUNTER — Telehealth: Payer: Self-pay | Admitting: Nurse Practitioner

## 2015-04-11 NOTE — Telephone Encounter (Signed)
Left message to call [name] at [phone]. 

## 2015-04-11 NOTE — Telephone Encounter (Signed)
Patient was told to call with update after taking progesterone. Last seen 03/02/15.

## 2015-04-12 NOTE — Telephone Encounter (Signed)
Spoke with patient. Patient states that she completed Provera challenge on 04/07/2015. Began to have light bleeding on 7/18-7/19. Is having light spotting today with using the restroom. Denies any current cramping or any heavy bleeding. States while taking the Provera her night sweats and hot flashes stopped. Has not had any since. Is wondering if she needs to continue with Estroven at this time without symptoms. Advised okay to monitor symptoms and restart if they begin again. Patient is agreeable. Advised will speak with Milford Cage, FNP regarding Provera challenge and return call with further recommendations.

## 2015-04-12 NOTE — Telephone Encounter (Signed)
Pt returning kaitlyn's call  Best: [phone]

## 2015-04-12 NOTE — Telephone Encounter (Signed)
Very often during a Provera challenge - the vaso symptoms dramatically improve.  Then if +/- withdrawal bleeding they will come back again in 2-3 weeks.  OK to monitor symptoms and start again or may try to et ahead on them and take now. Daily.

## 2015-04-13 NOTE — Telephone Encounter (Signed)
Yes to BUM and yes she needs to monitor for 3 months and advise if any bleeding or not

## 2015-04-13 NOTE — Telephone Encounter (Signed)
Does patient need to monitor cycles for three months and return call with update? Does she need BUM?  Routing to Regina Eck CNM for review as Milford Cage, FNP is out of the office.

## 2015-04-14 NOTE — Telephone Encounter (Signed)
Spoke with patient. Advised of message as seen below from [place] and Milford Cage, FNP. Patient is agreeable and verbalizes understanding. Patient will return call in three months to provided update on if she has had any further bleeding. Patient states she has been experiencing diarrhea and reflux since shortly after starting the Provera. Was taking Provera with food. Has been off the Provera for 1 week and is still having intermittent diarrhea. Advised will need to continue to hydrate well. Needs to monitor symptoms. If they continue needs to seek care with PCP for further evaluation. Patient is agreeable.  Routing to provider for final review. Patient agreeable to disposition. Will close encounter.   Patient aware provider will review message and nurse will return call if any additional advice or change of disposition.

## 2015-06-05 ENCOUNTER — Encounter: Payer: Self-pay | Admitting: Nurse Practitioner

## 2015-06-05 ENCOUNTER — Ambulatory Visit (INDEPENDENT_AMBULATORY_CARE_PROVIDER_SITE_OTHER): Payer: BLUE CROSS/BLUE SHIELD | Admitting: Nurse Practitioner

## 2015-06-05 ENCOUNTER — Encounter: Payer: Self-pay | Admitting: *Deleted

## 2015-06-05 VITALS — BP 114/70 | HR 68 | Temp 98.2°F | Ht 62.25 in

## 2015-06-05 DIAGNOSIS — N760 Acute vaginitis: Secondary | ICD-10-CM | POA: Diagnosis not present

## 2015-06-05 NOTE — Progress Notes (Signed)
49 y.o.Married white female G0 here with complaint of vaginal symptoms of itching, burning, and increase discharge. Describes discharge as white thick.  Onset of symptoms 4 days ago. Took Diflucan on Friday and Sunday. She did feel better but not well.  Decided to come in and make sure no other problems as her external vulvar area is burning.  Last menses after Provera challenge was in July.  No menses since then and vaso symptoms are better since taking Provera.  Now off [place].  Denies new personal products.  She has noted some vaginal dryness. No STD concerns. Urinary symptoms none.    O:Healthy female WDWN Affect: normal, orientation x 3  Exam: no acute distress Abdomen: soft and non tender Lymph node: no enlargement or tenderness Pelvic exam: External genital: normal female with both labia very swollen and red.  Several area of skin pealed off the left labia.  No ulcerations. Some redness into the groin all significant for yeast BUS: negative Vagina: white thin discharge noted.  Affirm taken Cervix: normal, non tender, no CMT   Affirm is taken   A: Recent treatment for yeast vaginitis  R/O BV   P: Discussed findings of yeast and / or BV and etiology. Discussed Aveeno or baking soda sitz bath for comfort. Avoid moist clothes or pads for extended period of time. If working out in gym clothes or swim suits for long periods of time change underwear or bottoms of swimsuit if possible. Olive Oil/Coconut Oil use for skin protection prior to activity can be used to external skin.  Rx: will await Affirm test  RV prn

## 2015-06-06 LAB — WET PREP BY MOLECULAR PROBE
Candida species: NEGATIVE
Gardnerella vaginalis: NEGATIVE
TRICHOMONAS VAG: NEGATIVE

## 2015-06-06 NOTE — Patient Instructions (Signed)
Will call with Affirm test results.

## 2015-06-07 NOTE — Progress Notes (Signed)
Encounter reviewed by Dr. Tiki Tucciarone Amundson C. Silva.  

## 2015-07-19 ENCOUNTER — Other Ambulatory Visit: Payer: Self-pay

## 2015-07-19 DIAGNOSIS — Z1231 Encounter for screening mammogram for malignant neoplasm of breast: Secondary | ICD-10-CM

## 2015-08-21 ENCOUNTER — Telehealth: Payer: Self-pay | Admitting: Nurse Practitioner

## 2015-08-21 ENCOUNTER — Encounter: Payer: Self-pay | Admitting: Nurse Practitioner

## 2015-08-21 ENCOUNTER — Ambulatory Visit (INDEPENDENT_AMBULATORY_CARE_PROVIDER_SITE_OTHER): Payer: BLUE CROSS/BLUE SHIELD | Admitting: Nurse Practitioner

## 2015-08-21 VITALS — BP 118/68 | HR 68 | Ht 62.25 in | Wt 139.0 lb

## 2015-08-21 DIAGNOSIS — Z0000 Encounter for general adult medical examination without abnormal findings: Secondary | ICD-10-CM | POA: Diagnosis not present

## 2015-08-21 DIAGNOSIS — Z01419 Encounter for gynecological examination (general) (routine) without abnormal findings: Secondary | ICD-10-CM

## 2015-08-21 DIAGNOSIS — N950 Postmenopausal bleeding: Secondary | ICD-10-CM | POA: Diagnosis not present

## 2015-08-21 LAB — POCT URINALYSIS DIPSTICK
BILIRUBIN UA: NEGATIVE
Glucose, UA: NEGATIVE
Ketones, UA: NEGATIVE
LEUKOCYTES UA: NEGATIVE
Nitrite, UA: NEGATIVE
Protein, UA: NEGATIVE
UROBILINOGEN UA: NEGATIVE
pH, UA: 8

## 2015-08-21 LAB — LIPID PANEL
Cholesterol: 180 mg/dL (ref 125–200)
HDL: 61 mg/dL (ref >=46)
LDL CALC: 99 mg/dL (ref <130)
Total CHOL/HDL Ratio: 3 Ratio (ref <=5.0)
Triglycerides: 98 mg/dL (ref <150)
VLDL: 20 mg/dL (ref <30)

## 2015-08-21 LAB — COMPREHENSIVE METABOLIC PANEL
ALT: 19 U/L (ref 6–29)
AST: 20 U/L (ref 10–35)
Albumin: 4.4 g/dL (ref 3.6–5.1)
Alkaline Phosphatase: 44 U/L (ref 33–130)
BILIRUBIN TOTAL: 0.6 mg/dL (ref 0.2–1.2)
BUN: 12 mg/dL (ref 7–25)
CALCIUM: 9.5 mg/dL (ref 8.6–10.4)
CO2: 31 mmol/L (ref 20–31)
CREATININE: 0.72 mg/dL (ref 0.50–1.05)
Chloride: 99 mmol/L (ref 98–110)
GLUCOSE: 77 mg/dL (ref 65–99)
Potassium: 4.2 mmol/L (ref 3.5–5.3)
SODIUM: 140 mmol/L (ref 135–146)
Total Protein: 6.6 g/dL (ref 6.1–8.1)

## 2015-08-21 NOTE — Telephone Encounter (Signed)
Patient was seen today and needs schedule a PUS. Patient says her insurance will renew 08/24/15 with the same policy. Patient wanted to make sure that this did not cause a problem. If you have any questions give her a call.

## 2015-08-21 NOTE — Progress Notes (Signed)
Patient ID: [name], female   DOB: 10-21-1965, 49 y.o.   MRN: [id number] 49 y.o. G0P0000 Married  Caucasian Fe here for annual exam.  Had a menses in March or April.  Then had another menses in November that lasted for 5 days so far, had no other symptoms.  PMP prior to this was 04/2014.  She is having no vaso symptoms.  When she took Provera challenge in July had no withdrawal bleeding.  [facility] 03/02/2015 was 124.5.  Last  Year 06/2014 was 69.7.  Patient's last menstrual period was 08/16/2015.          Sexually active: No.  The current method of family planning is none.    Exercising: Yes.    cardio, walking, elliptical, weights Smoker:  no  Health Maintenance: Pap: 06/25/12, WNL, neg HR HPV  MMG: 05/11/14 with diagnostic 05/26/14, BI-Rads 1: negative, apt 08/25/15 Colonoscopy: 10/07/14, 2 polyps, repeat in 10 years TDaP: 06/25/12  Labs:  Will get done today - non fasting   reports that she quit smoking about 3 years ago. She has never used smokeless tobacco. She reports that she does not drink alcohol or use illicit drugs.  Past Medical History  Diagnosis Date  . Dysmenorrhea   . Migraines   . Vaginal cyst     Past Surgical History  Procedure Laterality Date  . Tympanostomy tube placement    . Pilonidal cyst excision  6/97  . Hemorrhoid surgery  3/99 & 7/03    X 2  . Cervical discectomy  12/2004    C 5-6  . Bartholin cyst marsupialization Right 08/18/2013    Procedure: BARTHOLIN CYST MARSUPIALIZATION;  Surgeon: Azalia Bilis, MD;  Location: [facility];  Service: Gynecology;  Laterality: Right;    Current Outpatient Prescriptions  Medication Sig Dispense Refill  . celecoxib (CELEBREX) 200 MG capsule Take 200 mg by mouth as needed.     . Multiple Vitamins-Calcium (ONE-A-DAY WOMENS PO) Take by mouth.     No current facility-administered medications for this visit.    Family History  Problem Relation Age of Onset  . Diabetes Mother   . Aneurysm Father     abdominal aneurysm   . Aneurysm Paternal Uncle     abdominal aneurysm  . Diabetes Maternal Grandmother   . Diabetes Maternal Grandfather   . Stroke Maternal Grandfather   . Hypertension Maternal Grandfather     ROS:  Pertinent items are noted in HPI.  Otherwise, a comprehensive ROS was negative.  Exam:   BP 118/68 mmHg  Pulse 68  Ht 5' 2.25" (1.581 m)  Wt 139 lb (63.05 kg)  BMI 25.22 kg/m2  LMP 08/16/2015 Height: 5' 2.25" (158.1 cm) Ht Readings from Last 3 Encounters:  08/21/15 5' 2.25" (1.581 m)  06/05/15 5' 2.25" (1.581 m)  03/02/15 5' 2.25" (1.581 m)    General appearance: alert, cooperative and appears stated age Head: Normocephalic, without obvious abnormality, atraumatic Neck: no adenopathy, supple, symmetrical, trachea midline and thyroid normal to inspection and palpation Lungs: clear to auscultation bilaterally Breasts: normal appearance, no masses or tenderness Heart: regular rate and rhythm Abdomen: soft, non-tender; no masses,  no organomegaly Extremities: extremities normal, atraumatic, no cyanosis or edema Skin: Skin color, texture, turgor normal. No rashes or lesions Lymph nodes: Cervical, supraclavicular, and axillary nodes normal. No abnormal inguinal nodes palpated Neurologic: Grossly normal   Pelvic: External genitalia:  no lesions              Urethra:  normal appearing urethra with no masses, tenderness or lesions              Bartholin's and Skene's: normal                 Vagina: normal appearing vagina with normal color and light pink discharge, no lesions              Cervix: anteverted              Pap taken: Yes.   Bimanual Exam:  Uterus:  normal size, contour, position, consistency, mobility, non-tender              Adnexa: no mass, fullness, tenderness               Rectovaginal: Confirms               Anus:  normal sphincter tone, no lesions  Chaperone present: yes  A:  Well Woman with normal exam  Menopausal with PMB 07/2015 Current  amenorrhea X 2 months Female relationship  History of right Bartholin gland cyst with I & D 08/2012 with flare 07/2013- now all resolved   P:   Reviewed health and wellness pertinent to exam  Pap smear as above  Mammogram is due and scheduled for 08/25/15  Consult with Dr. Sabra Heck - will get PUS and possible endo biopsy   Will follow with labs.  Counseled on breast self exam, mammography screening, adequate intake of calcium and vitamin D, diet and exercise return annually or prn  An After Visit Summary was printed and given to the patient.

## 2015-08-21 NOTE — Progress Notes (Signed)
Reviewed personally.  M. [name] Brucha Ahlquist, MD.  

## 2015-08-21 NOTE — Patient Instructions (Signed)

## 2015-08-22 LAB — HEPATITIS C ANTIBODY: HCV Ab: NEGATIVE

## 2015-08-22 LAB — TSH: TSH: 1.252 u[IU]/mL (ref 0.350–4.500)

## 2015-08-22 LAB — VITAMIN D 25 HYDROXY (VIT D DEFICIENCY, FRACTURES): Vit D, 25-Hydroxy: 35 ng/mL (ref 30–100)

## 2015-08-22 LAB — HIV ANTIBODY (ROUTINE TESTING W REFLEX): HIV: NONREACTIVE

## 2015-08-22 LAB — FOLLICLE STIMULATING HORMONE: FSH: 19.1 m[IU]/mL

## 2015-08-22 NOTE — Telephone Encounter (Signed)
Return call to patient to discuss benefit. Left voicemail for return call.

## 2015-08-22 NOTE — Telephone Encounter (Signed)
Called patient to review benefit for pelvic ultrasound. Left voicemail to return call.

## 2015-08-22 NOTE — Telephone Encounter (Signed)
Return call to Becky. °

## 2015-08-23 LAB — IPS PAP TEST WITH HPV

## 2015-08-23 NOTE — Telephone Encounter (Signed)
Spoke with patient regarding benefit for pelvic ultrasound. Patient understands and agreeable. Patient ready to schedule. Patient agreeable to opening 08/24/15 @ 230. Patient understands and agreeable to arrival date/time. Patient understands and agreeable to 72 hour cancellation policy with [id number] fee. No further questions. Ok to close.

## 2015-08-24 ENCOUNTER — Ambulatory Visit (INDEPENDENT_AMBULATORY_CARE_PROVIDER_SITE_OTHER): Payer: BLUE CROSS/BLUE SHIELD | Admitting: Obstetrics & Gynecology

## 2015-08-24 ENCOUNTER — Encounter: Payer: Self-pay | Admitting: Obstetrics & Gynecology

## 2015-08-24 ENCOUNTER — Other Ambulatory Visit: Payer: Self-pay | Admitting: Obstetrics & Gynecology

## 2015-08-24 ENCOUNTER — Ambulatory Visit (INDEPENDENT_AMBULATORY_CARE_PROVIDER_SITE_OTHER): Payer: BLUE CROSS/BLUE SHIELD

## 2015-08-24 ENCOUNTER — Other Ambulatory Visit: Payer: Self-pay

## 2015-08-24 VITALS — BP 118/68 | HR 68 | Resp 16 | Ht 62.25 in | Wt 139.0 lb

## 2015-08-24 DIAGNOSIS — N950 Postmenopausal bleeding: Secondary | ICD-10-CM | POA: Diagnosis not present

## 2015-08-24 NOTE — Progress Notes (Signed)
49 y.o. G0 Married Caucasian emale here for a pelvic ultrasound with sonohystogram due to due to PMP bleeding.  Pt's had [facility] 6/16 at 124 and hasn't had a cycle since March.  She even took provera during this time without a withdrawal bleeding.  Then, in November, she had what felt exactly like a cycle that was heavy and last for 5 days.  She had some spotting for several days after.  She did have some small clots and associated cramping.  Had no PMS symptoms.  Reports her vasomotor symptoms have really improved as well over the last three months.   Patient's last menstrual period was 08/16/2015.  Contraception: not needed due to same sex marriage  Technique:  Both transabdominal and transvaginal ultrasound examinations of the pelvis were performed. Transabdominal technique was performed for global imaging of the pelvis including uterus, ovaries, adnexal regions, and pelvic cul-de-sac.  It was necessary to proceed with endovaginal exam following the abdominal ultrasound transabdominal exam to visualize the endometrium and adnexa.  Color and duplex Doppler ultrasound was utilized to evaluate blood flow to the ovaries.   FINDINGS: Uterus: 7.6 x 4.3 x 3.6cm Endometrium: 9.21mm Adnexa:  Left: 2.1 x 1.4 x 1.3cm     Right: 2.7 x 0.9 x 1.6cm Cul de sac: no free fluid  SHSG:  After obtaining appropriate verbal consent from patient, the cervix was visualized using a speculum, and prepped with betadine.  A tenaculum  was applied to the cervix.  Dilation of the cervix was not necessary. The catheter was passed into the uterus and sterile saline introduced, with the following findings: at least three polyp appearing lesions noted  Images reviewed with pt.  Hysteroscopy with polyp resection recommended, including D&C, due to bleeding and appearance of endometrium on ultrasound.  Procedure discussed with patient.  Recovery and pain management discussed.  Risks discussed including but not limited to bleeding, rare  risk of transfusion, infection, 1% risk of uterine perforation with risks of fluid deficit causing cardiac arrythmia, cerebral swelling and/or need to stop procedure early.  Fluid emboli and rare risk of death discussed.  DVT/PE, rare risk of risk of bowel/bladder/ureteral/vascular injury.  Patient aware if pathology abnormal she may need additional treatment.  All questions answered.    Pt would like to talk with nurse for scheduling dates and she will do that today.  Assessment:  PMP bleeding Endometrial polyps  Plan:  Hysteroscopy with polyp resection, D&C planned.    ~25 minutes spent with patient >50% of time was in face to face discussion of above.

## 2015-08-25 ENCOUNTER — Telehealth: Payer: Self-pay | Admitting: Obstetrics & Gynecology

## 2015-08-25 ENCOUNTER — Encounter: Payer: Self-pay | Admitting: Obstetrics & Gynecology

## 2015-08-25 ENCOUNTER — Ambulatory Visit
Admission: RE | Admit: 2015-08-25 | Discharge: 2015-08-25 | Disposition: A | Discharge status: Home / Self Care | Payer: BLUE CROSS/BLUE SHIELD | Source: Ambulatory Visit

## 2015-08-25 DIAGNOSIS — Z1231 Encounter for screening mammogram for malignant neoplasm of breast: Secondary | ICD-10-CM

## 2015-08-25 NOTE — Telephone Encounter (Signed)
Routing to business office to complete precert information.

## 2015-08-25 NOTE — Telephone Encounter (Addendum)
Patient called she said Gay Filler gave her surgery dates, she would like to be scheduled on 10/09/15 at 10:00. If you can call her to confirm that this has been scheduled and if you are unable to reach her then you can leave a detailed message. Best # to reach: [phone]

## 2015-08-25 NOTE — Telephone Encounter (Signed)
Discussed estimated surgery benefit with patient. Understood and agreeable. Provided benefit payment in full via phone. Patient aware professional benefit only and hospital will contact separately with facility benefit. Provided patient with contact number to preservice center. Patient ready to move forward with scheduling for 10/09/2015. Surgery folder back to [facility]. No further questions. Routing to [employer] for review with scheduling.

## 2015-08-28 ENCOUNTER — Other Ambulatory Visit: Payer: Self-pay | Admitting: Obstetrics & Gynecology

## 2015-08-28 NOTE — Telephone Encounter (Signed)
Left message to call in regards to making pre opt appointment and post opt appointment. -eh

## 2015-08-28 NOTE — Telephone Encounter (Signed)
Patient called back and I gave her pre opt instructions. I also made her pre and post opt visits. A copy of this info was also mailed to the patient. -eh

## 2015-09-15 ENCOUNTER — Encounter: Payer: Self-pay | Admitting: Obstetrics & Gynecology

## 2015-09-15 ENCOUNTER — Ambulatory Visit (INDEPENDENT_AMBULATORY_CARE_PROVIDER_SITE_OTHER): Payer: BLUE CROSS/BLUE SHIELD | Admitting: Obstetrics & Gynecology

## 2015-09-15 VITALS — BP 102/70 | HR 74 | Resp 16 | Ht 62.25 in | Wt 135.0 lb

## 2015-09-15 DIAGNOSIS — N950 Postmenopausal bleeding: Secondary | ICD-10-CM

## 2015-09-15 MED ORDER — OXYCODONE-ACETAMINOPHEN 5-325 MG PO TABS: 2.0000 | ORAL_TABLET | ORAL | Status: DC | PRN

## 2015-09-15 NOTE — Progress Notes (Signed)
49 y.o. G0P0000 MarriedCaucasian female here for discussion of upcoming procedure:  D&C, Hysteroscopy with polyp resection with possible myosure.  Ultrasound performed 08/24/15 showing at least three endometrial polyps.       Ob Hx:   Patient's last menstrual period was 08/16/2015 (approximate).          Sexually active: Yes.   Birth control: no method. Same sex partner.  Last pap: 08/21/15 Neg. HR HPV:neg Last MMG: 08/28/15 BIRADS1:neg Tobacco: No  Past Surgical History  Procedure Laterality Date  . Tympanostomy tube placement    . Pilonidal cyst excision  6/97  . Hemorrhoid surgery  3/99 & 7/03    X 2  . Cervical discectomy  12/2004    C 5-6  . Bartholin cyst marsupialization Right 08/18/2013    Procedure: BARTHOLIN CYST MARSUPIALIZATION;  Surgeon: Azalia Bilis, MD;  Location: [facility];  Service: Gynecology;  Laterality: Right;    Past Medical History  Diagnosis Date  . Dysmenorrhea   . Migraines   . Vaginal cyst     Allergies: Prilosec and Vicodin  Current Outpatient Prescriptions  Medication Sig Dispense Refill  . celecoxib (CELEBREX) 200 MG capsule Take 200 mg by mouth as needed.     . Multiple Vitamins-Calcium (ONE-A-DAY WOMENS PO) Take by mouth.    . ALPRAZolam (XANAX) 0.25 MG tablet Reported on 09/15/2015  0   No current facility-administered medications for this visit.    ROS: Pertinent items noted in HPI and remainder of comprehensive ROS otherwise negative.  Exam:    BP 102/70 mmHg  Pulse 74  Resp 16  Ht 5' 2.25" (1.581 m)  Wt 135 lb (61.236 kg)  BMI 24.50 kg/m2  LMP 08/16/2015 (Approximate)  General appearance: alert and cooperative Head: Normocephalic, without obvious abnormality, atraumatic Neck: no adenopathy, supple, symmetrical, trachea midline and thyroid not enlarged, symmetric, no tenderness/mass/nodules Lungs: clear to auscultation bilaterally Heart: regular rate and rhythm, S1, S2 normal, no murmur, click, rub or gallop Abdomen: soft,  non-tender; bowel sounds normal; no masses,  no organomegaly Extremities: extremities normal, atraumatic, no cyanosis or edema Skin: Skin color, texture, turgor normal. No rashes or lesions Lymph nodes: Cervical, supraclavicular, and axillary nodes normal. no inguinal nodes palpated Neurologic: Grossly normal  Pelvic: No exam   A: PMP bleeding Endometrial polyps   P: Hysteroscopy, polyp resection, D&C  planned Rx for Percocet given. Pt knows needs to stop any aspirin products. Hysterectomy brochure given for pre and post op instructions.

## 2015-09-26 ENCOUNTER — Telehealth: Payer: Self-pay | Admitting: Obstetrics & Gynecology

## 2015-09-26 MED ORDER — OXYCODONE-ACETAMINOPHEN 5-325 MG PO TABS: 2.0000 | ORAL_TABLET | ORAL | Status: DC | PRN

## 2015-09-26 NOTE — Telephone Encounter (Signed)
Left message to call Kaitlyn at [phone]. 

## 2015-09-26 NOTE — Telephone Encounter (Signed)
Patient was in the office 09/15/2015 to see Dr. Sabra Heck there was 2 prescriptions that Dr. Sabra Heck was going to prescribe to patient. One is Percocet which was ordered and printed. Patient says she has not received either prescription. Is needing these prescriptions before surgery. Best # to reach: [phone]

## 2015-09-26 NOTE — Telephone Encounter (Signed)
Spoke with patient. Patient states that she received a call from the hospital yesterday and was reviewing her medications. Patient realized she did not receive her rx for Percocet. Patient called the pharmacy and rx is not on file. "I think I may have forgotten to get it from her while I was there. I think she mentioned two prescriptions, but I could be wrong." Advised I will speak with Dr.Miller to clarify and return call with further information. Patient is agreeable.  New rx for oxycodone-acetaminophen 5-325 mg take 2 tablets by mouth every 4 hours as needed #12 0RF reprinted and to [place] for signature. Is there another rx the patient needs? Ibuprofen?

## 2015-09-26 NOTE — Telephone Encounter (Signed)
Pt has celebrex so can use this post operatively.  She shouldn't use motrin with the celebrex so that is why there is only one rx.

## 2015-09-26 NOTE — Telephone Encounter (Signed)
Spoke with patient. Advised of message as seen below from [place]. Patient is agreeable. Patient would like to come by the office this week to pick up rx for Oxycodone-Acetaminophen 5/325 mg #12 0RF. Rx to the front for patient pick up.  Routing to provider for final review. Patient agreeable to disposition. Will close encounter.

## 2015-10-09 ENCOUNTER — Encounter (HOSPITAL_COMMUNITY)
Admission: RE | Disposition: A | Discharge status: Home / Self Care | Payer: Self-pay | Source: Ambulatory Visit | Attending: Obstetrics & Gynecology

## 2015-10-09 ENCOUNTER — Ambulatory Visit (HOSPITAL_COMMUNITY): Payer: BLUE CROSS/BLUE SHIELD | Admitting: Anesthesiology

## 2015-10-09 ENCOUNTER — Ambulatory Visit (HOSPITAL_COMMUNITY)
Admission: RE | Admit: 2015-10-09 | Discharge: 2015-10-09 | Disposition: A | Discharge status: Home / Self Care | Payer: BLUE CROSS/BLUE SHIELD | Source: Ambulatory Visit | Attending: Obstetrics & Gynecology | Admitting: Obstetrics & Gynecology

## 2015-10-09 ENCOUNTER — Encounter (HOSPITAL_COMMUNITY): Payer: Self-pay

## 2015-10-09 DIAGNOSIS — N950 Postmenopausal bleeding: Secondary | ICD-10-CM | POA: Insufficient documentation

## 2015-10-09 DIAGNOSIS — N840 Polyp of corpus uteri: Secondary | ICD-10-CM | POA: Insufficient documentation

## 2015-10-09 DIAGNOSIS — Z87891 Personal history of nicotine dependence: Secondary | ICD-10-CM | POA: Diagnosis not present

## 2015-10-09 HISTORY — PX: DILATATION & CURETTAGE/HYSTEROSCOPY WITH MYOSURE: SHX6511

## 2015-10-09 HISTORY — DX: Nausea with vomiting, unspecified: R11.2

## 2015-10-09 HISTORY — DX: Other specified postprocedural states: Z98.890

## 2015-10-09 LAB — CBC
HCT: 41.8 % (ref 36.0–46.0)
Hemoglobin: 14.3 g/dL (ref 12.0–15.0)
MCH: 32.8 pg (ref 26.0–34.0)
MCHC: 34.2 g/dL (ref 30.0–36.0)
MCV: 95.9 fL (ref 78.0–100.0)
PLATELETS: 350 10*3/uL (ref 150–400)
RBC: 4.36 MIL/uL (ref 3.87–5.11)
RDW: 12.8 % (ref 11.5–15.5)
WBC: 9.1 10*3/uL (ref 4.0–10.5)

## 2015-10-09 SURGERY — DILATATION & CURETTAGE/HYSTEROSCOPY WITH MYOSURE
Anesthesia: General

## 2015-10-09 MED ORDER — FENTANYL CITRATE (PF) 100 MCG/2ML IJ SOLN
INTRAMUSCULAR | Status: DC | PRN
  Administered 2015-10-09: 50 ug via INTRAVENOUS

## 2015-10-09 MED ORDER — LIDOCAINE HCL (CARDIAC) 20 MG/ML IV SOLN
INTRAVENOUS | Status: DC | PRN
  Administered 2015-10-09: 100 mg via INTRAVENOUS

## 2015-10-09 MED ORDER — MEPERIDINE HCL 25 MG/ML IJ SOLN: 6.2500 mg | INTRAMUSCULAR | Status: DC | PRN

## 2015-10-09 MED ORDER — MIDAZOLAM HCL 2 MG/2ML IJ SOLN
INTRAMUSCULAR | Status: AC
  Filled 2015-10-09: qty 2

## 2015-10-09 MED ORDER — PROPOFOL 10 MG/ML IV BOLUS
INTRAVENOUS | Status: DC | PRN
  Administered 2015-10-09: 180 mg via INTRAVENOUS
  Administered 2015-10-09 (×2): 50 mg via INTRAVENOUS

## 2015-10-09 MED ORDER — ONDANSETRON HCL 4 MG/2ML IJ SOLN: 4.0000 mg | Freq: Once | INTRAMUSCULAR | Status: DC | PRN

## 2015-10-09 MED ORDER — LACTATED RINGERS IV SOLN
INTRAVENOUS | Status: DC
  Administered 2015-10-09 (×2): via INTRAVENOUS

## 2015-10-09 MED ORDER — MIDAZOLAM HCL 2 MG/2ML IJ SOLN
INTRAMUSCULAR | Status: DC | PRN
  Administered 2015-10-09: 2 mg via INTRAVENOUS

## 2015-10-09 MED ORDER — LIDOCAINE HCL (CARDIAC) 20 MG/ML IV SOLN
INTRAVENOUS | Status: AC
  Filled 2015-10-09: qty 5

## 2015-10-09 MED ORDER — FENTANYL CITRATE (PF) 100 MCG/2ML IJ SOLN
INTRAMUSCULAR | Status: AC
  Filled 2015-10-09: qty 2

## 2015-10-09 MED ORDER — FENTANYL CITRATE (PF) 100 MCG/2ML IJ SOLN
25.0000 ug | INTRAMUSCULAR | Status: DC | PRN
  Administered 2015-10-09: 25 ug via INTRAVENOUS

## 2015-10-09 MED ORDER — LIDOCAINE-EPINEPHRINE 1 %-1:100000 IJ SOLN
INTRAMUSCULAR | Status: AC
  Filled 2015-10-09: qty 1

## 2015-10-09 MED ORDER — LIDOCAINE-EPINEPHRINE 1 %-1:100000 IJ SOLN
INTRAMUSCULAR | Status: DC | PRN
  Administered 2015-10-09: 10 mL

## 2015-10-09 MED ORDER — SCOPOLAMINE 1 MG/3DAYS TD PT72
MEDICATED_PATCH | TRANSDERMAL | Status: AC
  Administered 2015-10-09: 1.5 mg via TRANSDERMAL
  Filled 2015-10-09: qty 1

## 2015-10-09 MED ORDER — SCOPOLAMINE 1 MG/3DAYS TD PT72
1.0000 | MEDICATED_PATCH | Freq: Once | TRANSDERMAL | Status: DC
  Administered 2015-10-09: 1.5 mg via TRANSDERMAL

## 2015-10-09 MED ORDER — PROPOFOL 10 MG/ML IV BOLUS
INTRAVENOUS | Status: AC
  Filled 2015-10-09: qty 20

## 2015-10-09 MED ORDER — KETOROLAC TROMETHAMINE 30 MG/ML IJ SOLN: 30.0000 mg | Freq: Once | INTRAMUSCULAR | Status: DC

## 2015-10-09 MED ORDER — SODIUM CHLORIDE 0.9 % IR SOLN
Status: DC | PRN
  Administered 2015-10-09: 3000 mL

## 2015-10-09 MED ORDER — PROPOFOL 500 MG/50ML IV EMUL
INTRAVENOUS | Status: DC | PRN
  Administered 2015-10-09: 100 ug/kg/min via INTRAVENOUS

## 2015-10-09 MED ORDER — DEXAMETHASONE SODIUM PHOSPHATE 10 MG/ML IJ SOLN
INTRAMUSCULAR | Status: DC | PRN
  Administered 2015-10-09: 4 mg via INTRAVENOUS

## 2015-10-09 MED ORDER — KETOROLAC TROMETHAMINE 30 MG/ML IJ SOLN
INTRAMUSCULAR | Status: DC | PRN
  Administered 2015-10-09: 30 mg via INTRAVENOUS

## 2015-10-09 MED ORDER — ONDANSETRON HCL 4 MG/2ML IJ SOLN
INTRAMUSCULAR | Status: AC
  Filled 2015-10-09: qty 2

## 2015-10-09 MED ORDER — ONDANSETRON HCL 4 MG/2ML IJ SOLN
INTRAMUSCULAR | Status: DC | PRN
  Administered 2015-10-09: 4 mg via INTRAVENOUS

## 2015-10-09 SURGICAL SUPPLY — 21 items
CANISTER SUCT 3000ML (MISCELLANEOUS) ×2 IMPLANT
CATH ROBINSON RED A/P 16FR (CATHETERS) ×2 IMPLANT
CLOTH BEACON ORANGE TIMEOUT ST (SAFETY) ×2 IMPLANT
CONTAINER PREFILL 10% NBF 60ML (FORM) ×2 IMPLANT
DEVICE MYOSURE LITE (MISCELLANEOUS) IMPLANT
DEVICE MYOSURE REACH (MISCELLANEOUS) IMPLANT
DILATOR CANAL MILEX (MISCELLANEOUS) IMPLANT
ELECT REM PT RETURN 9FT ADLT (ELECTROSURGICAL)
ELECTRODE REM PT RTRN 9FT ADLT (ELECTROSURGICAL) IMPLANT
FILTER ARTHROSCOPY CONVERTOR (FILTER) ×2 IMPLANT
GLOVE BIOGEL PI IND STRL 7.0 (GLOVE) ×2 IMPLANT
GLOVE BIOGEL PI INDICATOR 7.0 (GLOVE) ×2
GLOVE ECLIPSE 6.5 STRL STRAW (GLOVE) ×4 IMPLANT
GOWN STRL REUS W/TWL LRG LVL3 (GOWN DISPOSABLE) ×4 IMPLANT
PACK VAGINAL MINOR WOMEN LF (CUSTOM PROCEDURE TRAY) ×2 IMPLANT
PAD OB MATERNITY 4.3X12.25 (PERSONAL CARE ITEMS) ×2 IMPLANT
SEAL ROD LENS SCOPE MYOSURE (ABLATOR) ×2 IMPLANT
TOWEL OR 17X24 6PK STRL BLUE (TOWEL DISPOSABLE) ×4 IMPLANT
TUBING AQUILEX INFLOW (TUBING) ×2 IMPLANT
TUBING AQUILEX OUTFLOW (TUBING) ×2 IMPLANT
WATER STERILE IRR 1000ML POUR (IV SOLUTION) ×2 IMPLANT

## 2015-10-09 NOTE — Anesthesia Procedure Notes (Signed)
Procedure Name: LMA Insertion Date/Time: 10/09/2015 10:49 AM Performed by: Hewitt Blade Pre-anesthesia Checklist: Patient identified, Emergency Drugs available, Suction available and Patient being monitored Patient Re-evaluated:Patient Re-evaluated prior to inductionOxygen Delivery Method: Circle system utilized Preoxygenation: Pre-oxygenation with 100% oxygen Intubation Type: IV induction Ventilation: Mask ventilation without difficulty LMA: LMA inserted LMA Size: 4.0 Number of attempts: 1 Placement Confirmation: positive ETCO2 and breath sounds checked- equal and bilateral Tube secured with: Tape Dental Injury: Teeth and Oropharynx as per pre-operative assessment

## 2015-10-09 NOTE — H&P (Signed)
[name] is an 50 y.o. female  G0 MWF here for hysteroscopy and polyp resection with D&C due to PMP bleeding and endometrial polyp.  Conservative management has been discussed vs surgical excision of the polyp.  Pt and I decided together to proceed with surgical removal of the polyp.  Risks and benefits have been discussed in my office and are documented in a previous note.  Pt is here with her partner.  All questions answered.  Pt ready to proceed.    Pertinent Gynecological History: Menses: post-menopausal Bleeding: post menopausal bleeding Contraception: post menopausal status DES exposure: denies Blood transfusions: none Sexually transmitted diseases: no past history Previous GYN Procedures: none  Last mammogram: normal Date: 12/16 Last pap: normal Date: 11/16 OB History: G0, P0   Menstrual History: Patient's last menstrual period was 08/16/2015.    Past Medical History  Diagnosis Date  . Dysmenorrhea   . Migraines   . Vaginal cyst   . PONV (postoperative nausea and vomiting)     Past Surgical History  Procedure Laterality Date  . Tympanostomy tube placement    . Pilonidal cyst excision  6/97  . Hemorrhoid surgery  3/99 & 7/03    X 2  . Cervical discectomy  12/2004    C 5-6  . Bartholin cyst marsupialization Right 08/18/2013    Procedure: BARTHOLIN CYST MARSUPIALIZATION;  Surgeon: Azalia Bilis, MD;  Location: [facility];  Service: Gynecology;  Laterality: Right;    Family History  Problem Relation Age of Onset  . Diabetes Mother   . Aneurysm Father     abdominal aneurysm  . Aneurysm Paternal Uncle     abdominal aneurysm  . Diabetes Maternal Grandmother   . Diabetes Maternal Grandfather   . Stroke Maternal Grandfather   . Hypertension Maternal Grandfather     Social History:  reports that she quit smoking about 3 years ago. She has never used smokeless tobacco. She reports that she does not drink alcohol or use illicit drugs.  Allergies:  Allergies   Allergen Reactions  . Prilosec [Omeprazole] Other (See Comments)    headaches  . Vicodin [Hydrocodone-Acetaminophen] Nausea And Vomiting    Prescriptions prior to admission  Medication Sig Dispense Refill Last Dose  . celecoxib (CELEBREX) 200 MG capsule Take 200 mg by mouth daily as needed for moderate pain.    Past Month at Unknown time  . Multiple Vitamins-Calcium (ONE-A-DAY WOMENS PO) Take 1 tablet by mouth daily.    10/08/2015 at Unknown time  . ALPRAZolam (XANAX) 0.25 MG tablet take 1 tablet by mouth PRN when flying  0 Unknown at Unknown time  . oxyCODONE-acetaminophen (PERCOCET) 5-325 MG tablet Take 2 tablets by mouth every 4 (four) hours as needed. use only as much as needed to relieve pain 12 tablet 0 Unknown at Unknown time    Review of Systems  All other systems reviewed and are negative.   Blood pressure 108/49, pulse 80, temperature 98.3 F (36.8 C), temperature source Oral, resp. rate 18, height 5\' 2"  (1.575 m), weight 133 lb (60.328 kg), last menstrual period 08/16/2015, SpO2 100 %. Physical Exam  Constitutional: She is oriented to person, place, and time. She appears well-developed and well-nourished.  Cardiovascular: Normal rate and regular rhythm.   Respiratory: Effort normal and breath sounds normal.  Neurological: She is alert and oriented to person, place, and time.  Skin: Skin is warm and dry.  Psychiatric: She has a normal mood and affect.    Results for  orders placed or performed during the hospital encounter of 10/09/15 (from the past 24 hour(s))  CBC     Status: None   Collection Time: 10/09/15  9:20 AM  Result Value Ref Range   WBC 9.1 4.0 - 10.5 K/uL   RBC 4.36 3.87 - 5.11 MIL/uL   Hemoglobin 14.3 12.0 - 15.0 g/dL   HCT 41.8 36.0 - 46.0 %   MCV 95.9 78.0 - 100.0 fL   MCH 32.8 26.0 - 34.0 pg   MCHC 34.2 30.0 - 36.0 g/dL   RDW 12.8 11.5 - 15.5 %   Platelets 350 150 - 400 K/uL    No results found.  Assessment/Plan: 50 yo G0 MWF here for  hysteroscopy with polyp resection, D&C.  All questions answered.  Pt ready to proceed.  Hale Bogus [name] 10/09/2015, 10:27 AM

## 2015-10-09 NOTE — Op Note (Signed)
10/09/2015  11:20 AM  PATIENT:  [name]  50 y.o. female  PRE-OPERATIVE DIAGNOSIS:  POST MENOPAUSAL BLEEDING, endometrial polyp  POST-OPERATIVE DIAGNOSIS:  POST MENOPAUSAL BLEEDING, polypoid appearing tissue  PROCEDURE:  Procedure(s): DILATATION & CURETTAGE/HYSTEROSCOPY  SURGEON:  Roben Tatsch [name]  ASSISTANTS: OR staff   ANESTHESIA:   LMA  ESTIMATED BLOOD LOSS: 10cc  BLOOD ADMINISTERED:none   FLUIDS: 1000ccLR  UOP: 50cc drained with I&O cath at beginning of procedure  SPECIMEN:  Endometrial curettings   DISPOSITION OF SPECIMEN:  PATHOLOGY  FINDINGS: fluffy appearing endometrium with polypoid tissue  DESCRIPTION OF OPERATION: Patient was taken to the operating room.  She is placed in the supine position. SCDs were on her lower extremities and functioning properly. General anesthesia with an LMA was administered without difficulty.   Legs were then placed in the [place] in the low lithotomy position. The legs were lifted to the high lithotomy position and the Betadine prep was used on the inner thighs perineum and vagina x3. Patient was draped in a normal standard fashion. An in and out catheterization with a red rubber Foley catheter was performed. Approximately 50 cc of clear urine was noted. A bivalve speculum was placed the vagina. The anterior lip of the cervix was grasped with single-tooth tenaculum.  A paracervical block of 1% lidocaine mixed one-to-one with epinephrine (1:100,000 units).  10 cc was used total. The cervix is dilated up to #23 [facility] dilators. The endometrial cavity sounded to 7.5 cm.   A 2.9 millimeter diagnostic hysteroscope was obtained. Saline was used as a hysteroscopic fluid. The hysteroscope was advanced through the endocervical canal into the endometrial cavity. The tubal ostia were noted bilaterally. They was fluffy tissue with polypoid appearance.  The hysteroscope was removed. A #1 toothed curette was used to curette the cavity  until rough gritty texture was noted in all quadrants.  A second curetting was performed and the endometrial cavity was visualized the third time. At this point the fluffy and polypoid tissue was completely removed.  At this point, the procedure was ended. The hysteroscope was removed. The fluid deficit was 70 cc. The tenaculum was removed from the anterior lip of the cervix. The speculum was removed from the vagina. The prep was cleansed of the patient's skin. The legs are positioned back in the supine position. Sponge, lap, needle, initially counts were correct x2. Patient was taken to recovery in stable condition.  COUNTS:  YES  PLAN OF CARE: Transfer to PACU

## 2015-10-09 NOTE — Discharge Instructions (Addendum)
Post-surgical Instructions, Outpatient Surgery  You may expect to feel dizzy, weak, and drowsy for as long as 24 hours after receiving the medicine that made you sleep (anesthetic). For the first 24 hours after your surgery:    Do not drive a car, ride a bicycle, participate in physical activities, or take public transportation until you are done taking narcotic pain medicines or as directed by Dr. Sabra Heck.   Do not drink alcohol or take tranquilizers.   Do not take medicine that has not been prescribed by your physicians.   Do not sign important papers or make important decisions while on narcotic pain medicines.   Have a responsible person with you.   PAIN MANAGEMENT  Motrin 800mg .  (This is the same as 4-200mg  over the counter tablets of Motrin or ibuprofen.)  You may take this every eight hours or as needed for cramping.   Don't take this if you are taking your Celebrex.  Percocet 5/325mg .  For more severe pain, take one or two tablets every four to six hours as needed for pain control.  (Remember that narcotic pain medications increase your risk of constipation.  If this becomes a problem, you may take an over the counter stool softener like Colace 100mg  up to four times a day.)  DO'S AND DON'T'S  Do not take a tub bath for one week.  You may shower on the first day after your surgery  Do not do any heavy lifting for one to two weeks.  This increases the chance of bleeding.  Do move around as you feel able.  Stairs are fine.  You may begin to exercise again as you feel able.  Do not lift any weights for two weeks.  Do not put anything in the vagina for two weeks--no tampons, intercourse, or douching.    REGULAR MEDIATIONS/VITAMINS:  You may restart all of your regular medications as prescribed.  You may restart all of your vitamins as you normally take them.    PLEASE CALL OR SEEK MEDICAL CARE IF:  You have persistent nausea and vomiting.   You have trouble eating or  drinking.   You have an oral temperature above 100.5.   You have constipation that is not helped by adjusting diet or increasing fluid intake. Pain medicines are a common cause of constipation.   You have heavy vaginal bleeding  You have redness or drainage from your incision(s) or there is increasing pain or tenderness near or in the surgical site.   If you have questions, please call the office at [phone].  If it is after hours, there will be instructions for how to reach the on call physician.    Post Anesthesia Home Care Instructions  Activity: Get plenty of rest for the remainder of the day. A responsible adult should stay with you for 24 hours following the procedure.  For the next 24 hours, DO NOT: -Drive a car -Paediatric nurse -Drink alcoholic beverages -Take any medication unless instructed by your physician -Make any legal decisions or sign important papers.  Meals: Start with liquid foods such as gelatin or soup. Progress to regular foods as tolerated. Avoid greasy, spicy, heavy foods. If nausea and/or vomiting occur, drink only clear liquids until the nausea and/or vomiting subsides. Call your physician if vomiting continues.  Special Instructions/Symptoms: Your throat may feel dry or sore from the anesthesia or the breathing tube placed in your throat during surgery. If this causes discomfort, gargle with warm salt water. The  discomfort should disappear within 24 hours.  If you had a scopolamine patch placed behind your ear for the management of post- operative nausea and/or vomiting:  1. The medication in the patch is effective for 72 hours, after which it should be removed.  Wrap patch in a tissue and discard in the trash. Wash hands thoroughly with soap and water. 2. You may remove the patch earlier than 72 hours if you experience unpleasant side effects which may include dry mouth, dizziness or visual disturbances. 3. Avoid touching the patch. Wash your  hands with soap and water after contact with the patch.

## 2015-10-09 NOTE — Anesthesia Postprocedure Evaluation (Signed)
Anesthesia Post Note  Patient: [name]  Procedure(s) Performed: Procedure(s) (LRB): DILATATION & CURETTAGE/HYSTEROSCOPY  (N/A)  Patient location during evaluation: PACU Anesthesia Type: General Level of consciousness: awake Pain management: pain level controlled Vital Signs Assessment: post-procedure vital signs reviewed and stable Respiratory status: spontaneous breathing Cardiovascular status: stable Postop Assessment: no signs of nausea or vomiting and adequate PO intake Anesthetic complications: no    Last Vitals:  Filed Vitals:   10/09/15 1215 10/09/15 1230  BP: 109/67   Pulse: 62   Temp:  36.6 C  Resp: 11     Last Pain:  Filed Vitals:   10/09/15 1242  PainSc: [place]

## 2015-10-09 NOTE — Transfer of Care (Signed)
Immediate Anesthesia Transfer of Care Note  Patient: [name]  Procedure(s) Performed: Procedure(s): DILATATION & CURETTAGE/HYSTEROSCOPY  (N/A)  Patient Location: PACU  Anesthesia Type:General  Level of Consciousness: awake, alert , oriented and patient cooperative  Airway & Oxygen Therapy: Patient Spontanous Breathing and Patient connected to nasal cannula oxygen  Post-op Assessment: Report given to RN and Post -op Vital signs reviewed and stable  Post vital signs: Reviewed and stable  Last Vitals:  Filed Vitals:   10/09/15 0927  BP: 108/49  Pulse: 80  Temp: 36.8 C  Resp: 18    Complications: No apparent anesthesia complications

## 2015-10-09 NOTE — Anesthesia Preprocedure Evaluation (Addendum)
Anesthesia Evaluation  Patient identified by MRN, date of birth, ID band Patient awake    Reviewed: Allergy & Precautions, NPO status , Patient's Chart, lab work & pertinent test results  History of Anesthesia Complications (+) PONV and history of anesthetic complications  Airway Mallampati: II  TM Distance: >3 FB Neck ROM: Full    Dental  (+) Teeth Intact   Pulmonary former smoker,    breath sounds clear to auscultation       Cardiovascular negative cardio ROS   Rhythm:Regular Rate:Normal     Neuro/Psych  Headaches, negative psych ROS   GI/Hepatic negative GI ROS, Neg liver ROS,   Endo/Other  negative endocrine ROS  Renal/GU negative Renal ROS  negative genitourinary   Musculoskeletal negative musculoskeletal ROS (+)   Abdominal   Peds negative pediatric ROS (+)  Hematology negative hematology ROS (+)   Anesthesia Other Findings   Reproductive/Obstetrics negative OB ROS                            Lab Results  Component Value Date   WBC 16.1* 08/18/2013   HGB 14.4 08/18/2013   HCT 40.7 08/18/2013   MCV 93.6 08/18/2013   PLT 323 08/18/2013   Lab Results  Component Value Date   CREATININE 0.72 08/21/2015   BUN 12 08/21/2015   NA 140 08/21/2015   K 4.2 08/21/2015   CL 99 08/21/2015   CO2 31 08/21/2015   No results found for: INR, PROTIME   Anesthesia Physical Anesthesia Plan  ASA: II  Anesthesia Plan: General   Post-op Pain Management:    Induction: Intravenous  Airway Management Planned: LMA  Additional Equipment:   Intra-op Plan:   Post-operative Plan: Extubation in OR  Informed Consent: I have reviewed the patients History and Physical, chart, labs and discussed the procedure including the risks, benefits and alternatives for the proposed anesthesia with the patient or authorized representative who has indicated his/her understanding and acceptance.    Dental advisory given  Plan Discussed with: CRNA  Anesthesia Plan Comments: (No Midazolam. TIVA, no volatile anesthetics due to history of severe PONV.)       Anesthesia Quick Evaluation

## 2015-10-10 ENCOUNTER — Encounter (HOSPITAL_COMMUNITY): Payer: Self-pay | Admitting: Obstetrics & Gynecology

## 2015-10-27 ENCOUNTER — Ambulatory Visit (INDEPENDENT_AMBULATORY_CARE_PROVIDER_SITE_OTHER): Payer: BLUE CROSS/BLUE SHIELD | Admitting: Obstetrics & Gynecology

## 2015-10-27 ENCOUNTER — Encounter: Payer: Self-pay | Admitting: Obstetrics & Gynecology

## 2015-10-27 VITALS — BP 108/60 | HR 76 | Resp 16 | Ht 62.25 in | Wt 137.0 lb

## 2015-10-27 DIAGNOSIS — N950 Postmenopausal bleeding: Secondary | ICD-10-CM | POA: Diagnosis not present

## 2015-10-27 NOTE — Progress Notes (Signed)
Post Operative Visit  Procedure: DILATATION & CURETTAGE/HYSTEROSCOPY WITH MYOSURE Days Post-op: 17  Subjective: Doing well.  Reviewed pathology and images.  Pt never took anything for pain.  Spotted for about a week.  All questions answered.  Objective: BP 108/60 mmHg  Pulse 76  Resp 16  Ht 5' 2.25" (1.581 m)  Wt 137 lb (62.143 kg)  BMI 24.86 kg/m2  EXAM General: alert and cooperative GI: soft, non-tender; bowel sounds normal; no masses,  no organomegaly Extremities: extremities normal, atraumatic, no cyanosis or edema  Gyn:  NAEFG, vagina pink without lesions, no bleeding or discharge, cervix closed, no CMT  Assessment: S/p hysteroscopy and D&C due to PMP bleeding with atrophic appearing endometrium noted on hysteroscopy  Plan: Pt advised to call if she had future bleeding.  Will plan follow up at already scheduled [facility].

## 2015-11-17 ENCOUNTER — Telehealth: Payer: Self-pay | Admitting: Obstetrics & Gynecology

## 2015-11-17 MED ORDER — FLUCONAZOLE 150 MG PO TABS: 150.0000 mg | ORAL_TABLET | Freq: Once | ORAL | Status: DC

## 2015-11-17 NOTE — Telephone Encounter (Signed)
Call to patient. She c/o of two days of external vaginal burning and irritation with new white discharge. Patient feels she is confident that this is yeast. Requests oral treatment with Diflucan. Declines visit as she has been in recently with Dr. Sabra Heck for post op and feels she will be uncomfortable with waiting over the weekend.   Denies fevers, vaginal bleeding or pelvic pain. No STD concerns.   Advised will send message to Dr. Sabra Heck and return call with response.  Patient requests detailed message on her voicemail with response.

## 2015-11-17 NOTE — Telephone Encounter (Signed)
Rx done for Diflucan 150mg  po x 1, repeat 72 hours.  If symptoms do not resolve, I'd like her to come in.  Thanks.

## 2015-11-17 NOTE — Telephone Encounter (Addendum)
Patient calling wanting to know if she can get a refill of Diflucan 150 mg as opposed to coming in. She says she knows the sign of a yeast infection and this is the start of one.  Best # to reach: [place], [place] [address] [place] [address] Phone: [phone] Fax: [phone]

## 2015-11-20 NOTE — Telephone Encounter (Signed)
Call to patient and left detailed message on voicemail as per her request. Advised of message below from Dr. Sabra Heck. Advised patient to call back with recurring symptoms.

## 2016-01-01 ENCOUNTER — Telehealth: Payer: Self-pay | Admitting: Obstetrics & Gynecology

## 2016-01-01 NOTE — Telephone Encounter (Signed)
Patient called and said, "I'd like to speak with the nurse about getting help with these post menopausal symptoms I am having." Patient declined an appointment until speaking with a nurse.

## 2016-01-02 NOTE — Telephone Encounter (Signed)
I would recommend using Prometrium at night as one of the side effects can be sleepiness so it often helps with insomnia.  100mg  Prometrium nightly is find to prescribe.  #30/1RF.  I'd like an update in a month.  Will determine then is helping and if additional RFs are needed.  Thanks.

## 2016-01-02 NOTE — Telephone Encounter (Signed)
Spoke with patient. She states that when she was on Provera it really helped with hot flashes and did not have any hot flashes for 6 months and now her symptoms have increased and OTC estroven is not helping her symptoms. She is waking up in the middle of the night, unable to sleep due to hot flashes and hot flashes now during the day.  Consult with Dr. Sabra Heck scheduled for patient for 01/04/16.   Routing to provider for final review. Patient agreeable to disposition. Will close encounter.

## 2016-01-03 ENCOUNTER — Telehealth: Payer: Self-pay | Admitting: Emergency Medicine

## 2016-01-03 MED ORDER — PROGESTERONE MICRONIZED 100 MG PO CAPS: 100.0000 mg | ORAL_CAPSULE | Freq: Every day | ORAL | Status: DC

## 2016-01-03 NOTE — Telephone Encounter (Signed)
Patient notified and aware of recommendations. Prometrium 100mg  #30/1 rfs sent to [facility] on [address]. Dr. Sabra Heck patient is wanting to know do you still want to see her tomorrow?  Please advise.  CC: Karen Chafe, RN

## 2016-01-03 NOTE — Telephone Encounter (Signed)
Call to patient with message from Dr. Sabra Heck:   I would recommend using Prometrium at night as one of the side effects can be sleepiness so it often helps with insomnia. 100mg  Prometrium nightly is find to prescribe. #30/1RF. I'd like an update in a month. Will determine then is helping and if additional RFs are needed. Thanks.

## 2016-01-03 NOTE — Telephone Encounter (Signed)
Spoke with patient. She will try Prometrium for one month and instructions given. Verbalizes understanding.   She will call back for update, refills or consult appointment in one month.  Routing to provider for final review. Patient agreeable to disposition. Will close encounter.

## 2016-01-04 ENCOUNTER — Ambulatory Visit: Payer: BLUE CROSS/BLUE SHIELD | Admitting: Obstetrics & Gynecology

## 2016-01-08 NOTE — Telephone Encounter (Signed)
Michele Mcalpine, RN at 01/03/2016 1:40 PM     Status: Signed       Expand All Collapse All   Spoke with patient. She will try Prometrium for one month and instructions given. Verbalizes understanding.  She will call back for update, refills or consult appointment in one month.  Routing to provider for final review. Patient agreeable to disposition. Will close encounter.

## 2016-01-31 ENCOUNTER — Telehealth: Payer: Self-pay | Admitting: Obstetrics & Gynecology

## 2016-01-31 NOTE — Telephone Encounter (Signed)
Patient was started on progesterone has helped with emotional state but has not helped with her night sweats. Patient is interested in a sleep aid that's not addictive and that can help with sleep. Best # to reach: [phone] Faythe Ghee to leave message) Preferred Pharmacy: [facility]

## 2016-01-31 NOTE — Telephone Encounter (Signed)
Routing to [place] for review and advise. Patient was started on Prometrium 100 mg daily on 01/03/2016.

## 2016-02-02 MED ORDER — ZOLPIDEM TARTRATE 5 MG PO TABS: 5.0000 mg | ORAL_TABLET | Freq: Every evening | ORAL | Status: DC | PRN

## 2016-02-02 NOTE — Telephone Encounter (Signed)
Routing to Dr.Miller for review and advise. 

## 2016-02-02 NOTE — Telephone Encounter (Signed)
Rx for [name] will be faxed to pharmacy.  Do not take more than every third night.  A rare but important side effect is sleep walking.  If has any issues or concerns about this, will need to stop.

## 2016-02-02 NOTE — Telephone Encounter (Signed)
Left message to call Mattheus Rauls at [phone]. 

## 2016-02-02 NOTE — Telephone Encounter (Signed)
Needs OV to discuss options.

## 2016-02-02 NOTE — Telephone Encounter (Signed)
Patient calling to check on the statis of phone call from 01/31/16 okay to leave detailed message. Best # to reach: [phone]

## 2016-02-02 NOTE — Telephone Encounter (Signed)
Spoke with patient. Advised of message as seen below form Dr.Miller. She is agreeable and verbalizes understanding. Appointment scheduled for 02/13/2016 at 4 pm with Dr.Miller. Agreeable to date and time.  Routing to provider for final review. Patient agreeable to disposition. Will close encounter.

## 2016-02-02 NOTE — Telephone Encounter (Signed)
Spoke with patient. Advised of message as seen below from [place]. Patient states that Ambien is a medication that she does not want to take. "I have heard some really bad things. I apologize I did not think this is one she would recommend so I did not mention this." Advised I will speak with Dr.Miller regarding additional recommendations and return call. Prescription for Ambien shredded.

## 2016-02-06 ENCOUNTER — Telehealth: Payer: Self-pay | Admitting: Obstetrics & Gynecology

## 2016-02-06 NOTE — Telephone Encounter (Signed)
Patient called and cancelled her appointment with Dr. Sabra Heck for a "med discussion" on 02/13/16 due to a "hair appointment." Patient expressed she will call back to reschedule at a later date. FYI only.

## 2016-02-13 ENCOUNTER — Ambulatory Visit: Payer: BLUE CROSS/BLUE SHIELD | Admitting: Obstetrics & Gynecology

## 2016-03-11 ENCOUNTER — Telehealth: Payer: Self-pay | Admitting: Obstetrics & Gynecology

## 2016-03-11 NOTE — Telephone Encounter (Signed)
Patient wants to speak with the nurse it is about her hemorrhoids.

## 2016-03-11 NOTE — Telephone Encounter (Signed)
Call to patient. Has problem with hemorrhoid that is not responding to usual over the counter treatment. Requests appointment with Patty to check area. Appointment with Edman Circle, FNP tomorrow at [facility] to provider for final review. Patient agreeable to disposition. Will close encounter.

## 2016-03-12 ENCOUNTER — Encounter: Payer: Self-pay | Admitting: Nurse Practitioner

## 2016-03-12 ENCOUNTER — Ambulatory Visit (INDEPENDENT_AMBULATORY_CARE_PROVIDER_SITE_OTHER): Payer: BLUE CROSS/BLUE SHIELD | Admitting: Nurse Practitioner

## 2016-03-12 VITALS — BP 100/56 | HR 64 | Temp 98.4°F | Resp 16 | Ht 62.25 in | Wt 137.0 lb

## 2016-03-12 DIAGNOSIS — K648 Other hemorrhoids: Secondary | ICD-10-CM

## 2016-03-12 DIAGNOSIS — R233 Spontaneous ecchymoses: Secondary | ICD-10-CM

## 2016-03-12 DIAGNOSIS — R238 Other skin changes: Secondary | ICD-10-CM

## 2016-03-12 LAB — CBC WITH DIFFERENTIAL/PLATELET
Basophils Absolute: 0 cells/uL (ref 0–200)
Basophils Relative: 0 %
Eosinophils Absolute: 94 cells/uL (ref 15–500)
Eosinophils Relative: 1 %
HEMATOCRIT: 39.7 % (ref 35.0–45.0)
Hemoglobin: 13.1 g/dL (ref 11.7–15.5)
LYMPHS PCT: 24 %
Lymphs Abs: 2256 cells/uL (ref 850–3900)
MCH: 32.4 pg (ref 27.0–33.0)
MCHC: 33 g/dL (ref 32.0–36.0)
MCV: 98.3 fL (ref 80.0–100.0)
MONO ABS: 846 {cells}/uL (ref 200–950)
MPV: 9.2 fL (ref 7.5–12.5)
Monocytes Relative: 9 %
NEUTROS PCT: 66 %
Neutro Abs: 6204 cells/uL (ref 1500–7800)
Platelets: 377 10*3/uL (ref 140–400)
RBC: 4.04 MIL/uL (ref 3.80–5.10)
RDW: 13.3 % (ref 11.0–15.0)
WBC: 9.4 10*3/uL (ref 3.8–10.8)

## 2016-03-12 MED ORDER — HYDROCORTISONE 2.5 % RE CREA: 1.0000 "application " | TOPICAL_CREAM | Freq: Two times a day (BID) | RECTAL | Status: DC

## 2016-03-12 NOTE — Patient Instructions (Signed)

## 2016-03-12 NOTE — Progress Notes (Signed)
Subjective:     Patient ID: [name], female   DOB: 05-02-1966, 50 y.o.   MRN: [id number]  HPI this 50 yo WMF  With history of hemorrhoids.  She has had 2 previous surgeries 3/99 & 7/03.  This time symptoms of rectal itching and pain have been present X 2 weeks.  She has used OTC Preparation H with some help.  She has seen BRRB on the tissue with wiping only.  Feels like there is a "cut".  She is having normal BM's without constipation.  Denies N/V, fever/ chills or abdominal pain.  She does complain of easy bruising - not sure of how long.     Review of Systems  Constitutional: Negative for fever, chills, diaphoresis, appetite change, fatigue and unexpected weight change.  Respiratory: Negative.   Cardiovascular: Negative.   Gastrointestinal: Positive for anal bleeding and rectal pain. Negative for nausea, vomiting, abdominal pain, diarrhea, constipation, blood in stool and abdominal distention.  Genitourinary: Negative.   Musculoskeletal: Negative.   Neurological: Negative.   Psychiatric/Behavioral: Negative.        Objective:   Physical Exam  Constitutional: She appears well-developed and well-nourished. No distress.  Cardiovascular: Normal rate.   Pulmonary/Chest: Effort normal.  Abdominal: Soft. She exhibits no distension and no mass. There is no tenderness. There is no rebound and no guarding.  Genitourinary:  On rectal exam there is no evidence of bleeding. On digital exam there is an internal hemorrhoid.   After verbal consent with pt laying on her left side in knee chest position Anal scope is inserted.  She was found to have an internal hemorrhoid about 1 cm size with a cut at the superior side.  There was evidence of other hemorrhoids, fissure or lesions. Patient tolerated the procedure well without pain or complications.       Assessment:     Internal hemorrhoid with scant rectal bleeding Recent history of bruising    Plan:     Anusol HC 2.5 % to use BID  prn Warm sitz bath prn Will follow with CBC

## 2016-03-13 NOTE — Progress Notes (Signed)
Encounter reviewed Jill Jertson, MD   

## 2016-06-17 ENCOUNTER — Encounter: Payer: Self-pay | Admitting: Obstetrics & Gynecology

## 2016-06-17 ENCOUNTER — Telehealth: Payer: Self-pay | Admitting: Obstetrics & Gynecology

## 2016-06-17 ENCOUNTER — Ambulatory Visit (INDEPENDENT_AMBULATORY_CARE_PROVIDER_SITE_OTHER): Payer: BLUE CROSS/BLUE SHIELD | Admitting: Obstetrics & Gynecology

## 2016-06-17 VITALS — BP 110/70 | HR 72 | Resp 16 | Ht 62.0 in | Wt 137.0 lb

## 2016-06-17 DIAGNOSIS — N950 Postmenopausal bleeding: Secondary | ICD-10-CM | POA: Diagnosis not present

## 2016-06-17 DIAGNOSIS — N840 Polyp of corpus uteri: Secondary | ICD-10-CM | POA: Diagnosis not present

## 2016-06-17 NOTE — Telephone Encounter (Signed)
Spoke with patient. Patient reports post menopausal bleeding. Patient states bleeding started 06/16/16. Patient states bleeding initially heavy, with several "nickel to quarter" sized clots. Patient reports initially changing tampon less than every hour. Patient reports bleeding has continued throughout night, patient currently wearing pad due to history of bartholin cyst. Patient states current pad on for almost 3 hours, more than 50% saturated. Advised patient she would need to come in for OV for further evaluation. Patient scheduled for 06/17/16 at 2:30pm with Dr. Sabra Heck. Advised patient to take 800mg  Motrin 1 hr prior to visit in preparation for possible EMB. Patient is agreeable.  Routing to provider for final review. Patient is agreeable to disposition. Will close encounter.   CC: Kem Boroughs, NP

## 2016-06-17 NOTE — Progress Notes (Signed)
GYNECOLOGY  VISIT   HPI: 50 y.o. G12P0000 Married Caucasian female with complaint of vaginal spotting that started after she was exercising yesterday afternoon.  This increased in severity over the last 24 hours.  She's using pads and she's changing every 3-4 hours.  She is passing clots today.  She is having a lot cramping.  Denies dizziness or SOB.  No palpitations.  Last Pap 11/16 neg except for endometrial cells and HR HPV was negative.  Ada 11/16 was 19.  GYNECOLOGIC HISTORY: Patient's last menstrual period was 08/16/2015.  Patient Active Problem List   Diagnosis Date Noted  . Bartholin cyst 08/17/2013    Past Medical History:  Diagnosis Date  . Dysmenorrhea   . Migraines   . PONV (postoperative nausea and vomiting)   . Vaginal cyst     Past Surgical History:  Procedure Laterality Date  . BARTHOLIN CYST MARSUPIALIZATION Right 08/18/2013   Procedure: BARTHOLIN CYST MARSUPIALIZATION;  Surgeon: Azalia Bilis, MD;  Location: [facility];  Service: Gynecology;  Laterality: Right;  . CERVICAL DISCECTOMY  12/2004   C 5-6  . DILATATION & CURETTAGE/HYSTEROSCOPY WITH MYOSURE N/A 10/09/2015   Procedure: DILATATION & CURETTAGE/HYSTEROSCOPY ;  Surgeon: Megan Salon, MD;  Location: [facility];  Service: Gynecology;  Laterality: N/A;  . HEMORRHOID SURGERY  3/99 & 7/03   X 2  . PILONIDAL CYST EXCISION  6/97  . TYMPANOSTOMY TUBE PLACEMENT      MEDS:  Reviewed in EPIC and UTD  ALLERGIES: Prilosec [omeprazole] and Vicodin [hydrocodone-acetaminophen]  Family History  Problem Relation Age of Onset  . Diabetes Mother   . Aneurysm Father     abdominal aneurysm  . Aneurysm Paternal Uncle     abdominal aneurysm  . Diabetes Maternal Grandmother   . Diabetes Maternal Grandfather   . Stroke Maternal Grandfather   . Hypertension Maternal Grandfather     SH:  Married, non smoker  Review of Systems  All other systems reviewed and are negative.   PHYSICAL EXAMINATION:    BP 110/70 (BP  Location: Right Arm, Patient Position: Sitting, Cuff Size: Normal)   Pulse 72   Resp 16   Ht 5\' 2"  (1.575 m)   Wt 137 lb (62.1 kg)   LMP 08/16/2015   BMI 25.06 kg/m     General appearance: alert, cooperative and appears stated age Abdomen: soft, non-tender; bowel sounds normal; no masses,  no organomegaly  Pelvic: External genitalia:  no lesions              Urethra:  normal appearing urethra with no masses, tenderness or lesions              Bartholins and Skenes: normal                 Vagina: normal appearing vagina with normal color and discharge, no lesions              Cervix: no lesions              Bimanual Exam:  Uterus:  normal size, contour, position, consistency, mobility, non-tender              Adnexa: no mass, fullness, tenderness              Anus:  normal sphincter tone, no lesions  Endometrial biopsy recommended.  Discussed with patient.  Verbal and written consent obtained.   Procedure:  Speculum placed.  Cervix visualized and cleansed with betadine prep.  A  single toothed tenaculum was applied to the anterior lip of the cervix.  Endometrial pipelle was advanced through the cervix into the endometrial cavity without difficulty.  Pipelle passed to 8cm.  Suction applied and pipelle removed with good tissue sample obtained.  Tenculum removed.  No bleeding noted.  Patient tolerated procedure well.  Chaperone was present for exam.  Assessment: PMP bleeding with clotting  Plan: FSH and estradiol pending. Endometrial biopsy results will be called to pt.

## 2016-06-17 NOTE — Telephone Encounter (Signed)
Patient is having some irregular bleeding with clots.  Patient is concerned because she is in monopause and is asking if this normal?

## 2016-06-18 LAB — ESTRADIOL: ESTRADIOL: 33 pg/mL

## 2016-06-18 LAB — FOLLICLE STIMULATING HORMONE: FSH: 22.1 m[IU]/mL

## 2016-06-20 ENCOUNTER — Other Ambulatory Visit: Payer: Self-pay | Admitting: Obstetrics & Gynecology

## 2016-06-20 ENCOUNTER — Telehealth: Payer: Self-pay | Admitting: Obstetrics & Gynecology

## 2016-06-20 DIAGNOSIS — N840 Polyp of corpus uteri: Secondary | ICD-10-CM

## 2016-06-20 DIAGNOSIS — N950 Postmenopausal bleeding: Secondary | ICD-10-CM

## 2016-06-20 NOTE — Telephone Encounter (Signed)
Results to Dr.Miller for review and advise. 

## 2016-06-20 NOTE — Telephone Encounter (Signed)
Spoke with pt.  Results given.  Had polyp removed in January.  As this biopsy also had a polyp on it, I'd like to repeat PUS.  Order placed.  Pt would like precert information before scheduling.  Ok to close encounter.  CC:  [name]

## 2016-06-20 NOTE — Telephone Encounter (Signed)
Patient calling requesting recent biopsy results.

## 2016-06-27 ENCOUNTER — Ambulatory Visit (INDEPENDENT_AMBULATORY_CARE_PROVIDER_SITE_OTHER): Payer: BLUE CROSS/BLUE SHIELD

## 2016-06-27 ENCOUNTER — Ambulatory Visit (INDEPENDENT_AMBULATORY_CARE_PROVIDER_SITE_OTHER): Payer: BLUE CROSS/BLUE SHIELD | Admitting: Obstetrics & Gynecology

## 2016-06-27 ENCOUNTER — Other Ambulatory Visit: Payer: Self-pay | Admitting: Obstetrics & Gynecology

## 2016-06-27 DIAGNOSIS — N950 Postmenopausal bleeding: Secondary | ICD-10-CM | POA: Diagnosis not present

## 2016-06-27 DIAGNOSIS — N840 Polyp of corpus uteri: Secondary | ICD-10-CM | POA: Diagnosis not present

## 2016-06-27 NOTE — Progress Notes (Signed)
50 y.o. G0 MWF here for a pelvic ultrasound with sonohystogram due to PMP bleeding and endometrial biopsy showing endometrial polyp.  Pt has polyp removal in January.  She had not experienced any bleeding since that time until last month.  She is not having any bleeding or complaints today.  Repeat Langdon was 22.1 on 06/17/16.  Patient's last menstrual period was 08/16/2015.  Contraception:  Not needed.  Technique:  Both transabdominal and transvaginal ultrasound examinations of the pelvis were performed. Transabdominal technique was performed for global imaging of the pelvis including uterus, ovaries, adnexal regions, and pelvic cul-de-sac.  It was necessary to proceed with endovaginal exam following the abdominal ultrasound transabdominal exam to visualize the endometrium and adnexa.  Color and duplex Doppler ultrasound was utilized to evaluate blood flow to the ovaries.    FINDINGS: Uterus: 7.0 x 4.2 x 3.3cm Endometrium: 4.55mm Adnexa:  Left: 2.7 x 1.5 x 1.7cm with 1.2 x [id number] simple follicle and 1.3 x [id number] follicle     Right: 2.5 x 1.5 x 0.9cm Cul de sac: no free fluid  SHSG:  After obtaining appropriate verbal consent from patient, the cervix was visualized using a speculum, and prepped with betadine.  A tenaculum  was not applied to the cervix.  Dilation of the cervix was not necessary. The catheter was passed into the uterus and sterile saline introduced, with the following findings: at least two polyps were noted measuring 34mm and 76mm.  Patient tolerated procedure well.  All instruments removed.  Pt and I reviewed findings.  D/w pt repeat hysteroscopy with polyp resection.  Pt asks about hysterectomy.  As this is second time for this procedure in about 9 months, I do not think this is unreasonable.  D/W pt both procedures, risks, recoveries, estimated time out of work.  She is going to consider this and discuss with partner and then will let me know.  Assessment:  PMP bleeding, endometrial  polyps  Plan:  Pt is considering options of hysteroscopy with polyp resection vs. Hysterectomy.  Pt will call and let me know decision.  Will precert both for pt.    ~30 minutes spent with patient >50% of time was in face to face discussion of above.

## 2016-06-30 ENCOUNTER — Encounter: Payer: Self-pay | Admitting: Obstetrics & Gynecology

## 2016-06-30 DIAGNOSIS — N950 Postmenopausal bleeding: Secondary | ICD-10-CM | POA: Insufficient documentation

## 2016-06-30 DIAGNOSIS — N840 Polyp of corpus uteri: Secondary | ICD-10-CM | POA: Insufficient documentation

## 2016-07-01 ENCOUNTER — Telehealth: Payer: Self-pay | Admitting: Obstetrics & Gynecology

## 2016-07-01 NOTE — Telephone Encounter (Signed)
Spoke with patient. Patient states she would like to proceed with having a hysterectomy at this time. Patient has more questions since her visit with Dr.Miller on 06/27/2016. States she will needs notes for [employer] and work for after surgery. Would like surgery on 08/05/2016.  -Heard having a hysterectomy can cause cramping and bloating. Would like to know if this can occur post-operatively? -What is the biggest risk factor or downside to having a hysterectomy? -"How long will I need someone with me all day?" -Would like to know if a hysterectomy will decrease her sex drive?

## 2016-07-01 NOTE — Telephone Encounter (Signed)
Patient has questions about surgery and wanting to proceed with scheduling.

## 2016-07-02 NOTE — Telephone Encounter (Signed)
Correction.  Pt does not have fibroids.  Thanks.

## 2016-07-02 NOTE — Telephone Encounter (Signed)
Called pt and left detailed message regarding her questions.  Advised to call back and any further questions.  Forwarding on to [place].  TLH/bilateral salpingectomy/cystoscopy.  Dx:  PMP bleeding, endometrial polyps, uterine fibroid  OK to close encounter.

## 2016-07-03 ENCOUNTER — Telehealth: Payer: Self-pay | Admitting: Obstetrics & Gynecology

## 2016-07-03 NOTE — Telephone Encounter (Signed)
Spoke with patient regarding benefits for recommended procedures. Patient is agreeable to information provided.  Patient is leaning toward scheduling a hysterectomy.  Patient to call back to confirm. Patient will desire a surgery date prior to the end of her current insurance plan year of 08/22/16.  Advised patient, once she confirms, our nurse supervisor will contact her for scheduling.  Patient is agreeable.  Routing to Dr Sabra Heck  cc: Lamont Snowball

## 2016-07-05 ENCOUNTER — Telehealth: Payer: Self-pay | Admitting: Obstetrics & Gynecology

## 2016-07-05 NOTE — Telephone Encounter (Signed)
Patient has provided surgery pre-payment for recommended surgery and is ready to proceed with scheduling. Forward chart to Nurse Supervisor, Lamont Snowball for scheduling.  Routing to [employer]  cc: Dr Sabra Heck

## 2016-07-08 NOTE — Telephone Encounter (Signed)
I would prefer she not ride to [place] two days after surgery.  Probably wouldn't be a problem but I just don't think that's a good idea.  It would be fine to do six weeks for FMLA.

## 2016-07-08 NOTE — Telephone Encounter (Signed)
Call to patient with response from Dr Sabra Heck. Patient will consider this and check dates with family and call back tomorrow with decision.

## 2016-07-08 NOTE — Telephone Encounter (Signed)
Call to patient regarding surgery scheduling dates. Patient first request is week of 08-05-16 but advised Dr Sabra Heck is unavailable this week. Discussed options of 11-6 or 11-7, 11-21 or 11-27.  Patient needs to know if she can travel to [place] for Thanksgiving in a car 2 days post op if chooses 08-13-16? Also needs to request 6 weeks FMLA instead of 4 weeks. Needs to know if Dr Sabra Heck agreeable to this before selecting date. Please advise.

## 2016-07-09 NOTE — Telephone Encounter (Signed)
Patient is returning a call to [facility] regarding surgery date. Patient has decided on 08/19/16.

## 2016-07-10 NOTE — Telephone Encounter (Signed)
Patient called to check on the status of the request. °

## 2016-07-11 ENCOUNTER — Encounter: Payer: Self-pay | Admitting: *Deleted

## 2016-07-11 NOTE — Telephone Encounter (Signed)
Call to patient. Advised surgery is scheduled for 08-19-16 at 67 at [facility] as previously discussed. Surgery instruction sheet reviewed and printed copy will be mailed to patient (see copy scanned to chart.) Patient requests letter from Dr Sabra Heck with surgery date and projected leave of 6 weeks for employer and gyn membership. States employer does not require FMLA forms, just a letter on letterhead. Advised letter will me mailed with surgery instruction form.  Routing to provider for final review. Patient agreeable to disposition. Will close encounter.

## 2016-07-30 ENCOUNTER — Ambulatory Visit: Payer: BLUE CROSS/BLUE SHIELD | Admitting: Obstetrics & Gynecology

## 2016-07-30 NOTE — Progress Notes (Deleted)
50 y.o. G0P0000 Married{Race/ethnicity:17218} female here for discussion of upcoming procedure. HYSTERECTOMY TOTAL LAPAROSCOPIC, LAPAROSCOPIC BILATERAL SALPINGECTOMY, CYSTOSCOPY planned due to ***.  Pre-op evaluation thus far has included ***.     Ob Hx:   Patient's last menstrual period was 08/16/2015.          Sexually active: {yes no:314532} Birth control: no method Last pap: 08/21/15 negative, HR HPV negative  Last MMG: 08/28/15 BIRADS 1 negative  Tobacco: Former Smoker  Past Surgical History:  Procedure Laterality Date  . BARTHOLIN CYST MARSUPIALIZATION Right 08/18/2013   Procedure: BARTHOLIN CYST MARSUPIALIZATION;  Surgeon: Azalia Bilis, MD;  Location: [facility];  Service: Gynecology;  Laterality: Right;  . CERVICAL DISCECTOMY  12/2004   C 5-6  . DILATATION & CURETTAGE/HYSTEROSCOPY WITH MYOSURE N/A 10/09/2015   Procedure: DILATATION & CURETTAGE/HYSTEROSCOPY ;  Surgeon: Megan Salon, MD;  Location: [facility];  Service: Gynecology;  Laterality: N/A;  . HEMORRHOID SURGERY  3/99 & 7/03   X 2  . PILONIDAL CYST EXCISION  6/97  . TYMPANOSTOMY TUBE PLACEMENT      Past Medical History:  Diagnosis Date  . Dysmenorrhea   . Migraines   . PONV (postoperative nausea and vomiting)   . Vaginal cyst     Allergies: Prilosec [omeprazole] and Vicodin [hydrocodone-acetaminophen]  Current Outpatient Prescriptions  Medication Sig Dispense Refill  . ALPRAZolam (XANAX) 0.25 MG tablet Reported on 10/27/2015  0  . celecoxib (CELEBREX) 200 MG capsule Take 200 mg by mouth daily as needed for moderate pain. Reported on 10/27/2015    . hydrocortisone (ANUSOL-HC) 2.5 % rectal cream Place 1 application rectally 2 (two) times daily. (Patient not taking: Reported on 06/17/2016) 30 g 1  . Multiple Vitamins-Calcium (ONE-A-DAY WOMENS PO) Take 1 tablet by mouth daily.      No current facility-administered medications for this visit.     ROS: {Ros - complete:30496}  Exam:    LMP 08/16/2015   General  appearance: alert and cooperative Head: Normocephalic, without obvious abnormality, atraumatic Neck: no adenopathy, supple, symmetrical, trachea midline and thyroid not enlarged, symmetric, no tenderness/mass/nodules Lungs: clear to auscultation bilaterally Heart: regular rate and rhythm, S1, S2 normal, no murmur, click, rub or gallop Abdomen: soft, non-tender; bowel sounds normal; no masses,  no organomegaly Extremities: extremities normal, atraumatic, no cyanosis or edema Skin: Skin color, texture, turgor normal. No rashes or lesions Lymph nodes: Cervical, supraclavicular, and axillary nodes normal. no inguinal nodes palpated Neurologic: Grossly normal  Pelvic: External genitalia:  no lesions              Urethra: normal appearing urethra with no masses, tenderness or lesions              Bartholins and Skenes: {EXAM; GYN RN:[id number]                 Vagina: {vagina:315903::"normal appearing vagina with normal color and discharge, no lesions"}              Cervix: {exam; gyn cervix:30847}              Pap taken: {yes no:314532}        Bimanual Exam:  Uterus:  {uterus:315905::"uterus is normal size, shape, consistency and nontender"}                                      Adnexa:    {adnexa:311645::"not indicated"}  Rectovaginal: Deferred                                      Anus:  {Exam; anus:16940}  A: {CCO Gynecologic Problems:21020265}     P:  *** planned Rx for Motrin and Percocet given. Medications/Vitamins reviewed.  Pt knows needs to stop ***. Hysterectomy brochure given for pre and post op instructions.

## 2016-07-31 DIAGNOSIS — L600 Ingrowing nail: Secondary | ICD-10-CM | POA: Diagnosis not present

## 2016-08-01 ENCOUNTER — Other Ambulatory Visit: Payer: Self-pay | Admitting: Obstetrics & Gynecology

## 2016-08-07 NOTE — Patient Instructions (Addendum)
Your procedure is scheduled on:  Monday, Nov. 27, 2017  Enter through the Main Entrance of [facility] at:  6:00 AM  Pick up the phone at the desk and dial [phone].  Call this number if you have problems the morning of surgery: [phone].  Remember: Do NOT eat food or drink after:  Midnight Sunday, Nov. 26, 2017  Take these medicines the morning of surgery with a SIP OF WATER:  Xanax if needed  Stop ALL herbal medications at this time   Do NOT wear jewelry (body piercing), metal hair clips/bobby pins, make-up, or nail polish. Do NOT wear lotions, powders, or perfumes.  You may wear deodorant. Do NOT shave for 48 hours prior to surgery. Do NOT bring valuables to the hospital. Contacts, dentures, or bridgework may not be worn into surgery.  Leave suitcase in car.  After surgery it may be brought to your room.  For patients admitted to the hospital, checkout time is 11:00 AM the day of discharge.  Bring a copy of your healthcare power of attorney and living will paper work day of surgery

## 2016-08-08 ENCOUNTER — Encounter (HOSPITAL_COMMUNITY)
Admission: RE | Admit: 2016-08-08 | Discharge: 2016-08-08 | Disposition: A | Discharge status: Home / Self Care | Payer: BLUE CROSS/BLUE SHIELD | Source: Ambulatory Visit | Attending: Obstetrics & Gynecology | Admitting: Obstetrics & Gynecology

## 2016-08-08 ENCOUNTER — Encounter (HOSPITAL_COMMUNITY): Payer: Self-pay

## 2016-08-08 DIAGNOSIS — Z01812 Encounter for preprocedural laboratory examination: Secondary | ICD-10-CM | POA: Insufficient documentation

## 2016-08-08 HISTORY — DX: Other cervical disc displacement, unspecified cervical region: M50.20

## 2016-08-08 HISTORY — DX: Unspecified hemorrhoids: K64.9

## 2016-08-08 HISTORY — DX: Other specified soft tissue disorders: M79.89

## 2016-08-08 HISTORY — DX: Unspecified osteoarthritis, unspecified site: M19.90

## 2016-08-08 LAB — CBC
HEMATOCRIT: 38.2 % (ref 36.0–46.0)
Hemoglobin: 13.1 g/dL (ref 12.0–15.0)
MCH: 32.3 pg (ref 26.0–34.0)
MCHC: 34.3 g/dL (ref 30.0–36.0)
MCV: 94.3 fL (ref 78.0–100.0)
Platelets: 329 10*3/uL (ref 150–400)
RBC: 4.05 MIL/uL (ref 3.87–5.11)
RDW: 12.8 % (ref 11.5–15.5)
WBC: 7.4 10*3/uL (ref 4.0–10.5)

## 2016-08-12 ENCOUNTER — Ambulatory Visit (INDEPENDENT_AMBULATORY_CARE_PROVIDER_SITE_OTHER): Payer: BLUE CROSS/BLUE SHIELD | Admitting: Obstetrics & Gynecology

## 2016-08-12 ENCOUNTER — Encounter: Payer: Self-pay | Admitting: Obstetrics & Gynecology

## 2016-08-12 VITALS — BP 100/60 | HR 78 | Resp 16 | Ht 62.0 in | Wt 136.0 lb

## 2016-08-12 DIAGNOSIS — N950 Postmenopausal bleeding: Secondary | ICD-10-CM

## 2016-08-12 DIAGNOSIS — N840 Polyp of corpus uteri: Secondary | ICD-10-CM

## 2016-08-12 MED ORDER — IBUPROFEN 800 MG PO TABS: 800.0000 mg | ORAL_TABLET | Freq: Three times a day (TID) | ORAL | 0 refills | Status: DC | PRN

## 2016-08-12 MED ORDER — OXYCODONE-ACETAMINOPHEN 5-325 MG PO TABS: 2.0000 | ORAL_TABLET | ORAL | 0 refills | Status: DC | PRN

## 2016-08-12 NOTE — Progress Notes (Signed)
50 y.o. G0P0000 MarriedCaucasian female here for discussion of treatment options for recurrent PMP bleeding due to endometrial polyps.   Pt has an [place] 70 in 10/15 and then 124 in 6/16 about a year ago and then in December had episode of bleeding.  This was evaluated with ultrasound showing at least three endometrial polyps.  She underwent a hysteroscopy with polyp resection, D&C.  Pathology was negative and did show polyps.  Pt then went about ten months and had another episode of bleeding that was heavy and with clots.  Repeat FSH was around 20 at this point.  Repeat biopsy was performed showing endometrial polyp.  SHGM was perofrmed 06/27/16 and this showed two more polyps.  Pt and I discussed removal of the polyps again with hysteroscopy.  She does not want to proceed with this again and is desirous of definitive treatment.  She just wants all of this to be "done".  We reviewed the differences in procedures as well as risks and she is sure is wants to proceed with hysterectomy.  We discussed ovarian preservation.  She is very comfortable with this.  Procedure discussed with patient.  Hospital stay, recovery and pain management all discussed.  Risks discussed including but not limited to bleeding, 1% risk of receiving a  transfusion, infection, 3-4% risk of bowel/bladder/ureteral/vascular injury discussed as well as possible need for additional surgery if injury does occur discussed.  DVT/PE and rare risk of death discussed.  My actual complications with prior surgeries discussed.  Vaginal cuff dehiscence discussed.  Hernia formation discussed.  Positioning and incision locations discussed.  Patient aware if pathology abnormal she may need additional treatment.  All questions answered.    Ob Hx:   Patient's last menstrual period was 08/16/2015.          Sexually active: Yes.   Birth control: post menopausal  Last pap: 08/21/15 Neg. HR HPV:neg  Last MMG: 08/28/15 BIRADS1:neg  Tobacco: No  Past Surgical  History:  Procedure Laterality Date  . BARTHOLIN CYST MARSUPIALIZATION Right 08/18/2013   Procedure: BARTHOLIN CYST MARSUPIALIZATION;  Surgeon: Azalia Bilis, MD;  Location: [facility];  Service: Gynecology;  Laterality: Right;  . CERVICAL DISCECTOMY  12/2004   C 5-6  . COLONOSCOPY    . DILATATION & CURETTAGE/HYSTEROSCOPY WITH MYOSURE N/A 10/09/2015   Procedure: DILATATION & CURETTAGE/HYSTEROSCOPY ;  Surgeon: Megan Salon, MD;  Location: [facility];  Service: Gynecology;  Laterality: N/A;  . HEMORRHOID SURGERY  3/99 & 7/03   X 2  . PILONIDAL CYST EXCISION  6/97  . TYMPANOSTOMY TUBE PLACEMENT      Past Medical History:  Diagnosis Date  . Arthritis   . Dysmenorrhea   . Hand swelling    right  . Hemorrhoid   . Herniated cervical disc   . Migraines   . PONV (postoperative nausea and vomiting)   . Vaginal cyst     Allergies: Prilosec [omeprazole] and Vicodin [hydrocodone-acetaminophen]  Current Outpatient Prescriptions  Medication Sig Dispense Refill  . ALPRAZolam (XANAX) 0.25 MG tablet Reported on 10/27/2015  0  . celecoxib (CELEBREX) 200 MG capsule Take 200 mg by mouth daily as needed for moderate pain. Reported on 10/27/2015    . Multiple Vitamins-Calcium (ONE-A-DAY WOMENS PO) Take 1 tablet by mouth daily.      No current facility-administered medications for this visit.     ROS: Pertinent items noted in HPI and remainder of comprehensive ROS otherwise negative.  Exam:    BP 100/60 (BP  Location: Right Arm, Patient Position: Sitting, Cuff Size: Normal)   Pulse 78   Resp 16   Ht 5\' 2"  (1.575 m)   Wt 136 lb (61.7 kg)   LMP 08/16/2015   BMI 24.87 kg/m   General appearance: alert and cooperative Head: Normocephalic, without obvious abnormality, atraumatic Neck: no adenopathy, supple, symmetrical, trachea midline and thyroid not enlarged, symmetric, no tenderness/mass/nodules Lungs: clear to auscultation bilaterally Heart: regular rate and rhythm, S1, S2 normal, no murmur, click,  rub or gallop Abdomen: soft, non-tender; bowel sounds normal; no masses,  no organomegaly Extremities: extremities normal, atraumatic, no cyanosis or edema Skin: Skin color, texture, turgor normal. No rashes or lesions Lymph nodes: Cervical, supraclavicular, and axillary nodes normal. no inguinal nodes palpated Neurologic: Grossly normal  Pelvic: External genitalia:  no lesions              Urethra: normal appearing urethra with no masses, tenderness or lesions              Bartholins and Skenes: normal                 Vagina: normal appearing vagina with normal color and discharge, no lesions              Cervix: normal appearance              Pap taken: No.        Bimanual Exam:  Uterus:  uterus is normal size, shape, consistency and nontender                                      Adnexa:    normal adnexa in size, nontender and no masses                                      Rectovaginal: Deferred                                      Anus:  normal sphincter tone, no lesions   A: Recurrent irregular bleeding  Recurrent endometrial polyps PMP earlier this year but now [facility] is lower (after two years of elevated FSH values 70 and 124)    P:  TLH/bilateral salpingectomy/cystoscopy planned Rx for Motrin and Percocet given.  Pt is allergic to Vicodin. Medications/Vitamins reviewed.   Hysterectomy brochure given for pre and post op instructions.  All questions answered.  ~25 minutes spent with patient >50% of time was in face to face discussion of above.

## 2016-08-13 ENCOUNTER — Encounter: Payer: Self-pay | Admitting: Obstetrics & Gynecology

## 2016-08-18 MED ORDER — ENOXAPARIN SODIUM 40 MG/0.4ML ~~LOC~~ SOLN
40.0000 mg | SUBCUTANEOUS | Status: AC
  Administered 2016-08-19: 40 mg via SUBCUTANEOUS
  Filled 2016-08-18: qty 0.4

## 2016-08-18 MED ORDER — DEXTROSE 5 % IV SOLN
2.0000 g | INTRAVENOUS | Status: AC
  Administered 2016-08-19: 2 g via INTRAVENOUS
  Filled 2016-08-18: qty 2

## 2016-08-19 ENCOUNTER — Ambulatory Visit (HOSPITAL_COMMUNITY): Payer: BLUE CROSS/BLUE SHIELD | Admitting: Certified Registered Nurse Anesthetist

## 2016-08-19 ENCOUNTER — Encounter (HOSPITAL_COMMUNITY)
Admission: RE | Disposition: A | Discharge status: Home / Self Care | Payer: Self-pay | Source: Ambulatory Visit | Attending: Obstetrics & Gynecology

## 2016-08-19 ENCOUNTER — Ambulatory Visit (HOSPITAL_COMMUNITY)
Admission: RE | Admit: 2016-08-19 | Discharge: 2016-08-19 | Disposition: A | Discharge status: Home / Self Care | Payer: BLUE CROSS/BLUE SHIELD | Source: Ambulatory Visit | Attending: Obstetrics & Gynecology | Admitting: Obstetrics & Gynecology

## 2016-08-19 ENCOUNTER — Encounter (HOSPITAL_COMMUNITY): Payer: Self-pay

## 2016-08-19 DIAGNOSIS — N946 Dysmenorrhea, unspecified: Secondary | ICD-10-CM | POA: Insufficient documentation

## 2016-08-19 DIAGNOSIS — G43909 Migraine, unspecified, not intractable, without status migrainosus: Secondary | ICD-10-CM | POA: Diagnosis not present

## 2016-08-19 DIAGNOSIS — Z888 Allergy status to other drugs, medicaments and biological substances status: Secondary | ICD-10-CM | POA: Insufficient documentation

## 2016-08-19 DIAGNOSIS — Z87891 Personal history of nicotine dependence: Secondary | ICD-10-CM | POA: Diagnosis not present

## 2016-08-19 DIAGNOSIS — M1990 Unspecified osteoarthritis, unspecified site: Secondary | ICD-10-CM | POA: Diagnosis not present

## 2016-08-19 DIAGNOSIS — N838 Other noninflammatory disorders of ovary, fallopian tube and broad ligament: Secondary | ICD-10-CM | POA: Diagnosis not present

## 2016-08-19 DIAGNOSIS — N72 Inflammatory disease of cervix uteri: Secondary | ICD-10-CM | POA: Diagnosis not present

## 2016-08-19 DIAGNOSIS — N950 Postmenopausal bleeding: Secondary | ICD-10-CM | POA: Diagnosis not present

## 2016-08-19 DIAGNOSIS — Z8249 Family history of ischemic heart disease and other diseases of the circulatory system: Secondary | ICD-10-CM | POA: Insufficient documentation

## 2016-08-19 DIAGNOSIS — Z885 Allergy status to narcotic agent status: Secondary | ICD-10-CM | POA: Insufficient documentation

## 2016-08-19 DIAGNOSIS — Z833 Family history of diabetes mellitus: Secondary | ICD-10-CM | POA: Diagnosis not present

## 2016-08-19 DIAGNOSIS — N840 Polyp of corpus uteri: Secondary | ICD-10-CM | POA: Diagnosis not present

## 2016-08-19 DIAGNOSIS — N920 Excessive and frequent menstruation with regular cycle: Secondary | ICD-10-CM | POA: Diagnosis not present

## 2016-08-19 DIAGNOSIS — K649 Unspecified hemorrhoids: Secondary | ICD-10-CM | POA: Diagnosis not present

## 2016-08-19 HISTORY — PX: LAPAROSCOPIC BILATERAL SALPINGECTOMY: SHX5889

## 2016-08-19 HISTORY — PX: CYSTOSCOPY: SHX5120

## 2016-08-19 HISTORY — PX: LAPAROSCOPIC HYSTERECTOMY: SHX1926

## 2016-08-19 LAB — HEMOGLOBIN AND HEMATOCRIT, BLOOD
HCT: 33.1 % — ABNORMAL LOW (ref 36.0–46.0)
HEMOGLOBIN: 11.6 g/dL — AB (ref 12.0–15.0)

## 2016-08-19 SURGERY — HYSTERECTOMY, TOTAL, LAPAROSCOPIC
Anesthesia: General | Site: Bladder

## 2016-08-19 MED ORDER — MENTHOL 3 MG MT LOZG
1.0000 | LOZENGE | OROMUCOSAL | Status: DC | PRN
  Filled 2016-08-19: qty 9

## 2016-08-19 MED ORDER — KETOROLAC TROMETHAMINE 30 MG/ML IJ SOLN
30.0000 mg | Freq: Four times a day (QID) | INTRAMUSCULAR | Status: DC
  Administered 2016-08-19 (×2): 30 mg via INTRAVENOUS
  Filled 2016-08-19 (×2): qty 1

## 2016-08-19 MED ORDER — FENTANYL CITRATE (PF) 100 MCG/2ML IJ SOLN
INTRAMUSCULAR | Status: DC | PRN
Start: 2016-08-19 — End: 2016-08-19
  Administered 2016-08-19: 100 ug via INTRAVENOUS
  Administered 2016-08-19 (×3): 50 ug via INTRAVENOUS

## 2016-08-19 MED ORDER — SILVER NITRATE-POT NITRATE 75-25 % EX MISC
CUTANEOUS | Status: AC
  Filled 2016-08-19: qty 1

## 2016-08-19 MED ORDER — MORPHINE SULFATE (PF) 4 MG/ML IV SOLN
1.0000 mg | INTRAVENOUS | Status: DC | PRN
  Filled 2016-08-19: qty 1

## 2016-08-19 MED ORDER — ACETAMINOPHEN 325 MG PO TABS: 650.0000 mg | ORAL_TABLET | ORAL | Status: DC | PRN

## 2016-08-19 MED ORDER — SODIUM CHLORIDE 0.9 % IJ SOLN
INTRAMUSCULAR | Status: DC | PRN
  Administered 2016-08-19: 10 mL via INTRAVENOUS

## 2016-08-19 MED ORDER — INFLUENZA VAC SPLIT QUAD 0.5 ML IM SUSY
0.5000 mL | PREFILLED_SYRINGE | INTRAMUSCULAR | Status: AC
  Administered 2016-08-19: 0.5 mL via INTRAMUSCULAR
  Filled 2016-08-19: qty 0.5

## 2016-08-19 MED ORDER — EPHEDRINE 5 MG/ML INJ
INTRAVENOUS | Status: AC
  Filled 2016-08-19: qty 10

## 2016-08-19 MED ORDER — SCOPOLAMINE 1 MG/3DAYS TD PT72
1.0000 | MEDICATED_PATCH | Freq: Once | TRANSDERMAL | Status: DC
  Administered 2016-08-19: 1.5 mg via TRANSDERMAL

## 2016-08-19 MED ORDER — MIDAZOLAM HCL 2 MG/2ML IJ SOLN
INTRAMUSCULAR | Status: AC
  Filled 2016-08-19: qty 2

## 2016-08-19 MED ORDER — ROPIVACAINE HCL 5 MG/ML IJ SOLN
INTRAMUSCULAR | Status: AC
  Filled 2016-08-19: qty 30

## 2016-08-19 MED ORDER — HYDROMORPHONE HCL 1 MG/ML IJ SOLN
0.2500 mg | INTRAMUSCULAR | Status: DC | PRN
  Administered 2016-08-19: 0.5 mg via INTRAVENOUS

## 2016-08-19 MED ORDER — STERILE WATER FOR IRRIGATION IR SOLN
Status: DC | PRN
  Administered 2016-08-19: 1000 mL

## 2016-08-19 MED ORDER — MIDAZOLAM HCL 2 MG/2ML IJ SOLN
INTRAMUSCULAR | Status: DC | PRN
  Administered 2016-08-19: 2 mg via INTRAVENOUS

## 2016-08-19 MED ORDER — SIMETHICONE 80 MG PO CHEW
80.0000 mg | CHEWABLE_TABLET | Freq: Four times a day (QID) | ORAL | Status: DC | PRN
  Filled 2016-08-19: qty 1

## 2016-08-19 MED ORDER — OXYCODONE-ACETAMINOPHEN 5-325 MG PO TABS: 1.0000 | ORAL_TABLET | ORAL | Status: DC | PRN

## 2016-08-19 MED ORDER — DEXAMETHASONE SODIUM PHOSPHATE 10 MG/ML IJ SOLN
INTRAMUSCULAR | Status: DC | PRN
  Administered 2016-08-19: 4 mg via INTRAVENOUS

## 2016-08-19 MED ORDER — SUGAMMADEX SODIUM 200 MG/2ML IV SOLN
INTRAVENOUS | Status: DC | PRN
  Administered 2016-08-19: 123.4 mg via INTRAVENOUS

## 2016-08-19 MED ORDER — HYDROMORPHONE HCL 1 MG/ML IJ SOLN
INTRAMUSCULAR | Status: AC
  Filled 2016-08-19: qty 1

## 2016-08-19 MED ORDER — ALUM & MAG HYDROXIDE-SIMETH 200-200-20 MG/5ML PO SUSP
30.0000 mL | ORAL | Status: DC | PRN
  Filled 2016-08-19: qty 30

## 2016-08-19 MED ORDER — DEXAMETHASONE SODIUM PHOSPHATE 4 MG/ML IJ SOLN
INTRAMUSCULAR | Status: AC
  Filled 2016-08-19: qty 1

## 2016-08-19 MED ORDER — BUPIVACAINE HCL (PF) 0.25 % IJ SOLN
INTRAMUSCULAR | Status: DC | PRN
  Administered 2016-08-19: 13 mL

## 2016-08-19 MED ORDER — GLYCOPYRROLATE 0.2 MG/ML IJ SOLN
INTRAMUSCULAR | Status: AC
  Filled 2016-08-19: qty 1

## 2016-08-19 MED ORDER — GLYCOPYRROLATE 0.2 MG/ML IJ SOLN
INTRAMUSCULAR | Status: DC | PRN
  Administered 2016-08-19: 0.1 mg via INTRAVENOUS

## 2016-08-19 MED ORDER — PROPOFOL 10 MG/ML IV BOLUS
INTRAVENOUS | Status: DC | PRN
  Administered 2016-08-19: 140 mg via INTRAVENOUS

## 2016-08-19 MED ORDER — MIDAZOLAM HCL 2 MG/2ML IJ SOLN: 0.5000 mg | Freq: Once | INTRAMUSCULAR | Status: DC | PRN

## 2016-08-19 MED ORDER — DEXTROSE-NACL 5-0.45 % IV SOLN: INTRAVENOUS | Status: DC

## 2016-08-19 MED ORDER — MEPERIDINE HCL 25 MG/ML IJ SOLN: 6.2500 mg | INTRAMUSCULAR | Status: DC | PRN

## 2016-08-19 MED ORDER — LACTATED RINGERS IV SOLN
INTRAVENOUS | Status: DC
  Administered 2016-08-19 (×2): via INTRAVENOUS

## 2016-08-19 MED ORDER — ONDANSETRON HCL 4 MG/2ML IJ SOLN
INTRAMUSCULAR | Status: DC | PRN
  Administered 2016-08-19: 4 mg via INTRAVENOUS

## 2016-08-19 MED ORDER — EPHEDRINE SULFATE 50 MG/ML IJ SOLN
INTRAMUSCULAR | Status: DC | PRN
  Administered 2016-08-19: 5 mg via INTRAVENOUS

## 2016-08-19 MED ORDER — BUPIVACAINE HCL (PF) 0.25 % IJ SOLN
INTRAMUSCULAR | Status: AC
  Filled 2016-08-19: qty 30

## 2016-08-19 MED ORDER — SODIUM CHLORIDE 0.9 % IJ SOLN
INTRAMUSCULAR | Status: AC
  Filled 2016-08-19: qty 50

## 2016-08-19 MED ORDER — ONDANSETRON HCL 4 MG/2ML IJ SOLN
INTRAMUSCULAR | Status: AC
  Filled 2016-08-19: qty 2

## 2016-08-19 MED ORDER — ROCURONIUM BROMIDE 100 MG/10ML IV SOLN
INTRAVENOUS | Status: AC
  Filled 2016-08-19: qty 1

## 2016-08-19 MED ORDER — PROMETHAZINE HCL 25 MG/ML IJ SOLN: 6.2500 mg | INTRAMUSCULAR | Status: DC | PRN

## 2016-08-19 MED ORDER — SUGAMMADEX SODIUM 200 MG/2ML IV SOLN
INTRAVENOUS | Status: AC
  Filled 2016-08-19: qty 2

## 2016-08-19 MED ORDER — SCOPOLAMINE 1 MG/3DAYS TD PT72
MEDICATED_PATCH | TRANSDERMAL | Status: AC
  Administered 2016-08-19: 1.5 mg via TRANSDERMAL
  Filled 2016-08-19: qty 1

## 2016-08-19 MED ORDER — ROCURONIUM BROMIDE 100 MG/10ML IV SOLN
INTRAVENOUS | Status: DC | PRN
  Administered 2016-08-19: 40 mg via INTRAVENOUS
  Administered 2016-08-19: 10 mg via INTRAVENOUS

## 2016-08-19 MED ORDER — LIDOCAINE HCL (CARDIAC) 20 MG/ML IV SOLN
INTRAVENOUS | Status: DC | PRN
  Administered 2016-08-19: 100 mg via INTRAVENOUS

## 2016-08-19 MED ORDER — PROPOFOL 10 MG/ML IV BOLUS
INTRAVENOUS | Status: AC
Start: 2016-08-19 — End: 2016-08-19
  Filled 2016-08-19: qty 20

## 2016-08-19 MED ORDER — FENTANYL CITRATE (PF) 250 MCG/5ML IJ SOLN
INTRAMUSCULAR | Status: AC
  Filled 2016-08-19: qty 5

## 2016-08-19 MED ORDER — SODIUM CHLORIDE 0.9 % IV SOLN
INTRAVENOUS | Status: DC | PRN
  Administered 2016-08-19: 60 mL

## 2016-08-19 MED ORDER — KETOROLAC TROMETHAMINE 30 MG/ML IJ SOLN: 30.0000 mg | Freq: Four times a day (QID) | INTRAMUSCULAR | Status: DC

## 2016-08-19 MED ORDER — KETOROLAC TROMETHAMINE 30 MG/ML IJ SOLN
INTRAMUSCULAR | Status: DC | PRN
  Administered 2016-08-19: 30 mg via INTRAVENOUS

## 2016-08-19 MED ORDER — LIDOCAINE HCL (CARDIAC) 20 MG/ML IV SOLN
INTRAVENOUS | Status: AC
  Filled 2016-08-19: qty 5

## 2016-08-19 SURGICAL SUPPLY — 60 items
APPLICATOR ARISTA FLEXITIP XL (MISCELLANEOUS) IMPLANT
BENZOIN TINCTURE PRP APPL 2/3 (GAUZE/BANDAGES/DRESSINGS) ×4 IMPLANT
CABLE HIGH FREQUENCY MONO STRZ (ELECTRODE) ×4 IMPLANT
CATH ROBINSON RED A/P 16FR (CATHETERS) IMPLANT
CLOTH BEACON ORANGE TIMEOUT ST (SAFETY) ×4 IMPLANT
COVER BACK TABLE 60X90IN (DRAPES) ×4 IMPLANT
COVER LIGHT HANDLE  1/PK (MISCELLANEOUS) ×1
COVER LIGHT HANDLE 1/PK (MISCELLANEOUS) ×3 IMPLANT
DERMABOND ADVANCED (GAUZE/BANDAGES/DRESSINGS)
DERMABOND ADVANCED .7 DNX12 (GAUZE/BANDAGES/DRESSINGS) IMPLANT
DRSG OPSITE POSTOP 3X4 (GAUZE/BANDAGES/DRESSINGS) IMPLANT
DURAPREP 26ML APPLICATOR (WOUND CARE) ×4 IMPLANT
GLOVE BIOGEL PI IND STRL 7.0 (GLOVE) ×15 IMPLANT
GLOVE BIOGEL PI INDICATOR 7.0 (GLOVE) ×5
GLOVE ECLIPSE 6.5 STRL STRAW (GLOVE) ×12 IMPLANT
GOWN STRL REUS W/TWL LRG LVL3 (GOWN DISPOSABLE) ×16 IMPLANT
HEMOSTAT ARISTA ABSORB 3G PWDR (MISCELLANEOUS) IMPLANT
LIGASURE VESSEL 5MM BLUNT TIP (ELECTROSURGICAL) ×4 IMPLANT
NEEDLE INSUFFLATION 120MM (ENDOMECHANICALS) ×4 IMPLANT
NS IRRIG 1000ML POUR BTL (IV SOLUTION) ×4 IMPLANT
OCCLUDER COLPOPNEUMO (BALLOONS) ×4 IMPLANT
PACK LAPAROSCOPY BASIN (CUSTOM PROCEDURE TRAY) ×4 IMPLANT
PACK TRENDGUARD 450 HYBRID PRO (MISCELLANEOUS) ×3 IMPLANT
POUCH LAPAROSCOPIC INSTRUMENT (MISCELLANEOUS) ×4 IMPLANT
POUCH SPECIMEN RETRIEVAL 10MM (ENDOMECHANICALS) IMPLANT
PROTECTOR NERVE ULNAR (MISCELLANEOUS) ×8 IMPLANT
SCISSORS LAP 5X35 DISP (ENDOMECHANICALS) ×4 IMPLANT
SEALER TISSUE G2 CVD JAW 35 (ENDOMECHANICALS) IMPLANT
SEALER TISSUE G2 CVD JAW 45CM (ENDOMECHANICALS)
SET CYSTO W/LG BORE CLAMP LF (SET/KITS/TRAYS/PACK) IMPLANT
SET IRRIG TUBING LAPAROSCOPIC (IRRIGATION / IRRIGATOR) ×4 IMPLANT
SET TRI-LUMEN FLTR TB AIRSEAL (TUBING) ×8 IMPLANT
SHEARS HARMONIC ACE PLUS 36CM (ENDOMECHANICALS) ×4 IMPLANT
SLEEVE ADV FIXATION 5X100MM (TROCAR) ×8 IMPLANT
SLEEVE XCEL OPT CAN 5 100 (ENDOMECHANICALS) ×8 IMPLANT
SOLUTION ELECTROLUBE (MISCELLANEOUS) ×4 IMPLANT
STRIP CLOSURE SKIN 1/2X4 (GAUZE/BANDAGES/DRESSINGS) ×4 IMPLANT
SUT VIC AB 0 CT1 27 (SUTURE) ×2
SUT VIC AB 0 CT1 27XBRD ANBCTR (SUTURE) ×6 IMPLANT
SUT VICRYL 0 UR6 27IN ABS (SUTURE) ×4 IMPLANT
SUT VICRYL 4-0 PS2 18IN ABS (SUTURE) ×8 IMPLANT
SUT VLOC 180 0 9IN  GS21 (SUTURE) ×1
SUT VLOC 180 0 9IN GS21 (SUTURE) ×3 IMPLANT
SYR 30ML LL (SYRINGE) IMPLANT
SYR 50ML LL SCALE MARK (SYRINGE) ×8 IMPLANT
SYSTEM CARTER THOMASON II (TROCAR) IMPLANT
TIP UTERINE 5.1X6CM LAV DISP (MISCELLANEOUS) ×4 IMPLANT
TIP UTERINE 6.7X10CM GRN DISP (MISCELLANEOUS) IMPLANT
TIP UTERINE 6.7X6CM WHT DISP (MISCELLANEOUS) IMPLANT
TIP UTERINE 6.7X8CM BLUE DISP (MISCELLANEOUS) IMPLANT
TOWEL OR 17X24 6PK STRL BLUE (TOWEL DISPOSABLE) ×8 IMPLANT
TRAY FOLEY CATH SILVER 14FR (SET/KITS/TRAYS/PACK) ×4 IMPLANT
TRENDGUARD 450 HYBRID PRO PACK (MISCELLANEOUS) ×4
TROCAR ADV FIXATION 5X100MM (TROCAR) ×4 IMPLANT
TROCAR BALLN 12MMX100 BLUNT (TROCAR) IMPLANT
TROCAR PORT AIRSEAL 8X120 (TROCAR) ×4 IMPLANT
TROCAR XCEL NON-BLD 11X100MML (ENDOMECHANICALS) ×4 IMPLANT
TROCAR XCEL NON-BLD 5MMX100MML (ENDOMECHANICALS) ×4 IMPLANT
WARMER LAPAROSCOPE (MISCELLANEOUS) ×4 IMPLANT
WATER STERILE IRR 1000ML POUR (IV SOLUTION) ×4 IMPLANT

## 2016-08-19 NOTE — Addendum Note (Signed)
Addendum  created 08/19/16 1549 by Ignacia Bayley, CRNA   Sign clinical note

## 2016-08-19 NOTE — Anesthesia Postprocedure Evaluation (Signed)
Anesthesia Post Note  Patient: [name]  Procedure(s) Performed: Procedure(s) (LRB): HYSTERECTOMY TOTAL LAPAROSCOPIC (N/A) LAPAROSCOPIC BILATERAL SALPINGECTOMY (Bilateral) CYSTOSCOPY (N/A)  Patient location during evaluation: PACU Anesthesia Type: General Level of consciousness: awake and alert, patient cooperative and oriented Pain management: pain level controlled Vital Signs Assessment: post-procedure vital signs reviewed and stable Respiratory status: spontaneous breathing, nonlabored ventilation, respiratory function stable and patient connected to nasal cannula oxygen Cardiovascular status: blood pressure returned to baseline and stable Postop Assessment: no signs of nausea or vomiting Anesthetic complications: no     Last Vitals:  Vitals:   08/19/16 1129 08/19/16 1226  BP: (!) 114/56 (!) 110/58  Pulse: (!) 52 74  Resp: 16 16  Temp: 36.6 C 36.5 C    Last Pain:  Vitals:   08/19/16 1210  TempSrc:   PainSc: 4    Pain Goal: Patients Stated Pain Goal: 3 (08/19/16 1210)               Seleta Rhymes. Evita Merida

## 2016-08-19 NOTE — Progress Notes (Signed)
Day of Surgery Procedure(s) (LRB): HYSTERECTOMY TOTAL LAPAROSCOPIC (N/A) LAPAROSCOPIC BILATERAL SALPINGECTOMY (Bilateral) CYSTOSCOPY (N/A)  Subjective: Patient reports she is feeling well.  Denies nausea.  Has voided x 3.  Feeling like she emptying her bladder well.  No vaginal bleeding.  She has walked without difficulty.  Has only gotten Toradol and pain control is excellent.  She's like to go hom.  Objective: I have reviewed patient's vital signs, intake and output and medications.  General: alert and cooperative Resp: clear to auscultation bilaterally Cardio: regular rate and rhythm, S1, S2 normal, no murmur, click, rub or gallop GI: soft, non-tender; bowel sounds normal; no masses,  no organomegaly and incision: clean, dry and intact Extremities: extremities normal, atraumatic, no cyanosis or edema Vaginal Bleeding: none   Assessment: s/p Procedure(s): HYSTERECTOMY TOTAL LAPAROSCOPIC (N/A) LAPAROSCOPIC BILATERAL SALPINGECTOMY (Bilateral) CYSTOSCOPY (N/A): stable  Plan: check hemoglobin now.  Consider discharge later today.  LOS: 0 days    [facility] 08/19/2016, 6:09 PM

## 2016-08-19 NOTE — Discharge Instructions (Signed)
Post Op Hysterectomy Instructions Please read the instructions below. Refer to these instructions for the next few weeks. These instructions provide you with general information on caring for yourself after surgery. Your caregiver may also give you specific instructions. While your treatment has been planned according to the most current medical practices available, unavoidable problems sometimes happen. If you have any problems or questions after you leave, please call your caregiver.  HOME CARE INSTRUCTIONS Healing will take time. You will have discomfort, tenderness, swelling and bruising at the operative site for a couple of weeks. This is normal and will get better as time goes on.   Only take over-the-counter or prescription medicines for pain, discomfort or fever as directed by your caregiver.   Do not take aspirin. It can cause bleeding.   Do not drive when taking pain medication.   Follow your caregivers advice regarding diet, exercise, lifting, driving and general activities.   Resume your usual diet as directed and allowed.   Get plenty of rest and sleep.   Do not douche, use tampons, or have sexual intercourse until your caregiver gives you permission. .   Take your temperature if you feel hot or flushed.   You may shower today when you get home.  No tub bath for one week.    Do not drink alcohol until you are not taking any narcotic pain medications.   Try to have someone home with you for a week or two to help with the household activities.   Be careful over the next two to three weeks with any activities at home that involve lifting, pushing, or pulling.  Listen to your body--if something feels uncomfortable to do, then don't do it.  Make sure you and your family understands everything about your operation and recovery.   Walking up stairs is fine.  Do not sign any legal documents until you feel normal again.   Keep all your follow-up appointments as recommended by  your caregiver.   Remove the large dressing and the patch behind your ear in three days--on Thursday.  PLEASE CALL THE OFFICE IF:  There is swelling, redness or increasing pain in the wound area.   Pus is coming from the wound.   You notice a bad smell from the wound or surgical dressing.   You have pain, redness and swelling from the intravenous site.   The wound is breaking open (the edges are not staying together).    You develop pain or bleeding when you urinate.   You develop abnormal vaginal discharge.   You have any type of abnormal reaction or develop an allergy to your medication.   You need stronger pain medication for your pain   SEEK IMMEDIATE MEDICAL CARE:  You develop a temperature of 100.5 or higher.   You develop abdominal pain.   You develop chest pain.   You develop shortness of breath.   You pass out.   You develop pain, swelling or redness of your leg.   You develop heavy vaginal bleeding with or without blood clots.   MEDICATIONS:  Restart your regular medications BUT wait one week before restarting all vitamins and mineral supplements  Use Motrin 800mg  every 8 hours for the next several days.  This will help you use less Percocet.  Use the Percocet 5/325 1-2 tabs every 4-6 hours as needed for pain.  You may use an over the counter stool softener like Colace or Dulcolax to help with starting a bowel movement.  Start the day after you go home.  Warm liquids, fluids, and ambulation help too.  If you have not had a bowel movement in four days, you need to call the office.

## 2016-08-19 NOTE — Op Note (Signed)
08/19/2016  10:09 AM  PATIENT:  [name]  50 y.o. female  PRE-OPERATIVE DIAGNOSIS:  recurrent PMB with endometrial polyp  POST-OPERATIVE DIAGNOSIS:  recurrent PMB with endometrial polyp  PROCEDURE:  Procedure(s): HYSTERECTOMY TOTAL LAPAROSCOPIC LAPAROSCOPIC BILATERAL SALPINGECTOMY CYSTOSCOPY  SURGEON:  Talayia Hjort [name]  ASSISTANTS: Sumner Boast, MD   ANESTHESIA:   general  ESTIMATED BLOOD LOSS: 25cc  BLOOD ADMINISTERED:none   FLUIDS: 1800cc LR  UOP: 150cc clear  SPECIMEN:  Uterus, cervix, and bilateral fallopian tubes  DISPOSITION OF SPECIMEN:  PATHOLOGY  FINDINGS: normal upper abdomen, normal appendix, normal ovaries, normal appearing uterus  DESCRIPTION OF OPERATION: Patient is taken to the operating room. She is placed in the supine position. She is a running IV in place. Informed consent was present on the chart. SCDs on her lower extremities and functioning properly.  Patient was positioned in the low lithotomy position with her legs in the [place]. Then arms were tucked by her side. General endotracheal anesthesia was administered by the anesthesia staff without difficulty. Dr. Glennon Mac oversaw case. Adequate anesthesia was confirmed.  Dura prep was then used to prep the abdomen and Betadine was used to prep the inner thighs, perineum and vagina. Once 3 minutes had past the patient was draped in a normal standard fashion. The legs were lifted to the high lithotomy position. The cervix was visualized by placing a heavy weighted speculum in the posterior aspect of the vagina and using a curved Deaver retractor to the retract anteriorly. The anterior lip of the cervix was grasped with single-tooth tenaculum.  The cervix sounded to 7 cm. Pratt dilators were used to dilate the cervix up to a #21. A RUMI uterine manipulator was obtained. A # 6 disposable tip was placed on the RUMI manipulator as well as a small, silver KOH ring. This was passed through the  cervix and the bulb of the disposable tip was inflated with 2 cc of normal saline. There was a good fit of the KOH ring around the cervix. The tenaculum was removed. There is also good manipulation of the uterus. The speculum and retractor were removed as well. A Foley catheter was placed to straight drain. Clear urine urine was noted. Legs were lowered to the low lithotomy position and attention was turned the abdomen.  The umbilicus was everted.  A Veress needle was obtained. Syringe of sterile saline was placed on a open Veress needle.  This was passed into the umbilicus until just when the fluid started to drip.  Then low flow CO2 gas was attached the needle and the pneumoperitoneum was achieved without difficulty. Once four liters of gas was in the abdomen the Veress needle was removed and a 5 millimeter non-bladed Optiview trocar and port were passed directly to the abdomen. The laparoscope was then used to confirm intraperitoneal placement. Findings were noted as above. Locations for RLQ and LLQ ports were noted by transillumination of the abdominal wall.  0.25% marcaine was used to anesthetize the skin.  73mm skin incisions were made and then 31mm bladed ports were placed.  Finally a midline incision was made about 4 cm above the pubic symphysis.  The skin was incised about 1cm and a non-bladed 8 air seal port was placed with direct visualization of the laparoscope.    Ureters were identifies.  Attention was turned to the right side. With uterus on stretch the left, tube was excised off the ovary and mesosalpinx was dissected to free the tube. Then the right  utero-ovarian pedicle was serially clamped cauterized and incised using the ligasure device. Right round ligament was serially clamped cauterized and incised. The anterior and posterior peritoneum of the inferior leaf of the broad ligament were opened. The beginning of the bladder flap was created.    Attention was turned the left side.  The uterus  was placed on stretch to the opposite side.  The tube was excised off the left ovary using sharp dissection with the LigaSure device  The mesosalpinx was incised freeing the tube. Then the left uterine ovarian pedicle was serially clamped cauterized and incised. Next the left round ligament was serially clamped cauterized and incised. The anterior posterior peritoneum of the inferior leaf of broad ligament were opened. The anterior peritoneum was carried across to the dissection on the left side. The remainder of the bladder flap was created using sharp dissection. The bladder was well below the level of the KOH ring. The left uterine artery skeletonized. Then the left uterine artery, above the level of the KOH ring, was serially clamped cauterized and incised.   Attention was turned back to the right side. The bladder was taken down below the level of the KOH ring on the right side. The right uterine artery skeletonized and then just superior to the KOH ring this vessel was serially clamped, cauterized, and incised. The uterus was devascularized at this point.  The colpotomy was performed a starting in the midline and using a harmonic scalpel with the inferior edge of the open blade  This was carried around a circumferential fashion until the vaginal mucosa was completely incised in the specimen was freed.  The specimen was then delivered to the vagina.  A vaginal occlusive device was used to maintain the pneumoperitoneum during the colpotomy. The specimen was delivered through the vagina and was used to occlude the vagina while the cuff was being closed.  Instruments were changed with a needle driver and Cobra graspers.  Using a 9 inch V. lock suture, the cuff was closed by incorporating the anterior and posterior vaginal mucosa in each stitch. This was carried across all the way to the left corner and a running fashion. Two stitches were brought back towards the midline and the suture was cut flush with the  vagina. The needle was brought out the pelvis. The pelvis was irrigated. All pedicles were inspected. No bleeding was noted. Pressure was relieved in the abdomen and no bleeding was noted. Ureters were noted deep in the pelvis to be peristalsing.  At this point the procedure was completed. The largest incision was closed with a fascial closure device after the port was removed.  Suture was tied and excellent closure of the fascia was noted.  The remaining instruments were removed.  The ports were removed under direct visualization of the laparoscope and the pneumoperitoneum was relieved.  The patient was taken out of Trendelenburg positioning.  Several deep breaths were given to the patient's trying to any gas the abdomen and finally the midline port was removed.  The skin was then closed with subcuticular stitches of 3-0 Vicryl. The skin was cleansed Dermabond was applied. Attention was then turned the vagina and the cuff was inspected. There was a small left sidewall laceration that was bleeding. A running interlocking suture of #2-0 Vicryl was used to make this hemostatic. The cuff was intact and the anterior and posterior vaginal mucosa was incorporated in each stitch. The Foley catheter was removed.  Cystoscopy was performed.  No sutures or  bladder injuries were noted.  Ureters were noted with normal urine jets from each one was seen.  Cystoscopic fluid was drained once the cystoscopy was performed. Foley was left out.  Sponge, lap, needle, and instrument counts were correct x2. Patient tolerated the procedure very well. She was awakened from anesthesia, extubated and taken to recovery in stable condition.   COUNTS:  YES  PLAN OF CARE: Transfer to PACU

## 2016-08-19 NOTE — Anesthesia Preprocedure Evaluation (Addendum)
Anesthesia Evaluation  Patient identified by MRN, date of birth, ID band Patient awake    Reviewed: Allergy & Precautions, NPO status , Patient's Chart, lab work & pertinent test results  History of Anesthesia Complications (+) PONV and history of anesthetic complications  Airway Mallampati: II  TM Distance: >3 FB Neck ROM: Full    Dental  (+) Dental Advisory Given   Pulmonary former smoker (quit 2013),    breath sounds clear to auscultation       Cardiovascular negative cardio ROS   Rhythm:Regular Rate:Normal     Neuro/Psych  Headaches,    GI/Hepatic negative GI ROS, Neg liver ROS,   Endo/Other  negative endocrine ROS  Renal/GU negative Renal ROS     Musculoskeletal  (+) Arthritis ,   Abdominal   Peds  Hematology negative hematology ROS (+)   Anesthesia Other Findings   Reproductive/Obstetrics                            Anesthesia Physical Anesthesia Plan  ASA: II  Anesthesia Plan: General   Post-op Pain Management:    Induction: Intravenous  Airway Management Planned: Oral ETT  Additional Equipment:   Intra-op Plan:   Post-operative Plan: Extubation in OR  Informed Consent: I have reviewed the patients History and Physical, chart, labs and discussed the procedure including the risks, benefits and alternatives for the proposed anesthesia with the patient or authorized representative who has indicated his/her understanding and acceptance.   Dental advisory given  Plan Discussed with: CRNA and Surgeon  Anesthesia Plan Comments: (Plan routine monitors, GETA)        Anesthesia Quick Evaluation

## 2016-08-19 NOTE — H&P (Signed)
[name] is an 50 y.o. female here for definitive treatment of perimenopausal/menopausal bleeding and recurrent endometrial polyps.  She is desirous of keeping ovaries if possible.  Procedure, risks, benefits and alternatives have all been discussed and are documented in the office chart.  Pt has no questions.    Pertinent Gynecological History: Bleeding: post menopausal bleeding Contraception: none DES exposure: denies Blood transfusions: none Sexually transmitted diseases: no past history Previous GYN Procedures: hysteroscopy  11/16 Last mammogram: normal Date: 12/16 Last pap: normal Date: 11/16 OB History: G0, P0   Menstrual History: Patient's last menstrual period was 08/16/2015.    Past Medical History:  Diagnosis Date  . Arthritis   . Dysmenorrhea   . Hand swelling    right  . Hemorrhoid   . Herniated cervical disc   . Migraines   . PONV (postoperative nausea and vomiting)     Past Surgical History:  Procedure Laterality Date  . BARTHOLIN CYST MARSUPIALIZATION Right 08/18/2013   Procedure: BARTHOLIN CYST MARSUPIALIZATION;  Surgeon: Azalia Bilis, MD;  Location: [facility];  Service: Gynecology;  Laterality: Right;  . CERVICAL DISCECTOMY  12/2004   C 5-6  . COLONOSCOPY    . DILATATION & CURETTAGE/HYSTEROSCOPY WITH MYOSURE N/A 10/09/2015   Procedure: DILATATION & CURETTAGE/HYSTEROSCOPY ;  Surgeon: Megan Salon, MD;  Location: [facility];  Service: Gynecology;  Laterality: N/A;  . HEMORRHOID SURGERY  3/99 & 7/03   X 2  . PILONIDAL CYST EXCISION  6/97  . TYMPANOSTOMY TUBE PLACEMENT      Family History  Problem Relation Age of Onset  . Diabetes Mother   . Aneurysm Father     abdominal aneurysm  . Aneurysm Paternal Uncle     abdominal aneurysm  . Diabetes Maternal Grandmother   . Diabetes Maternal Grandfather   . Stroke Maternal Grandfather   . Hypertension Maternal Grandfather     Social History:  reports that she quit smoking about 4 years ago. She has a  25.00 pack-year smoking history. She has never used smokeless tobacco. She reports that she drinks alcohol. She reports that she does not use drugs.  Allergies:  Allergies  Allergen Reactions  . Prilosec [Omeprazole] Other (See Comments)    headaches  . Vicodin [Hydrocodone-Acetaminophen] Nausea And Vomiting    Prescriptions Prior to Admission  Medication Sig Dispense Refill Last Dose  . ibuprofen (ADVIL,MOTRIN) 800 MG tablet Take 1 tablet (800 mg total) by mouth every 8 (eight) hours as needed. 30 tablet 0 Past Week at Unknown time  . Multiple Vitamins-Calcium (ONE-A-DAY WOMENS PO) Take 1 tablet by mouth daily.    Past Week at Unknown time  . ALPRAZolam (XANAX) 0.25 MG tablet Reported on 10/27/2015  0 Unknown at Unknown time  . celecoxib (CELEBREX) 200 MG capsule Take 200 mg by mouth daily as needed for moderate pain. Reported on 10/27/2015   Unknown at Unknown time  . oxyCODONE-acetaminophen (PERCOCET) 5-325 MG tablet Take 2 tablets by mouth every 4 (four) hours as needed. use only as much as needed to relieve pain 30 tablet 0 Unknown at Unknown time    Review of Systems  All other systems reviewed and are negative.   Blood pressure (!) 105/53, pulse 62, temperature 97.8 F (36.6 C), temperature source Oral, resp. rate 18, last menstrual period 08/16/2015, SpO2 100 %. Physical Exam  Constitutional: She is oriented to person, place, and time. She appears well-developed and well-nourished.  Cardiovascular: Normal rate and regular rhythm.   Respiratory:  Effort normal and breath sounds normal.  Neurological: She is alert and oriented to person, place, and time.  Skin: Skin is warm and dry.  Psychiatric: She has a normal mood and affect.    No results found for this or any previous visit (from the past 24 hour(s)).  No results found.  Assessment/Plan: 50 yo G0 MWF here for definitive treatment of irregular bleeding and recurrent endometrial polyps.  All questions answered.  Pt ready  to proceed.  Hale Bogus [name] 08/19/2016, 6:54 AM

## 2016-08-19 NOTE — Anesthesia Postprocedure Evaluation (Signed)
Anesthesia Post Note  Patient: [name]  Procedure(s) Performed: Procedure(s) (LRB): HYSTERECTOMY TOTAL LAPAROSCOPIC (N/A) LAPAROSCOPIC BILATERAL SALPINGECTOMY (Bilateral) CYSTOSCOPY (N/A)  Patient location during evaluation: [facility] Anesthesia Type: General Level of consciousness: awake Pain management: pain level controlled Vital Signs Assessment: post-procedure vital signs reviewed and stable Respiratory status: spontaneous breathing Cardiovascular status: stable Postop Assessment: no signs of nausea or vomiting and adequate PO intake Anesthetic complications: no     Last Vitals:  Vitals:   08/19/16 1129 08/19/16 1226  BP: (!) 114/56 (!) 110/58  Pulse: (!) 52 74  Resp: 16 16  Temp: 36.6 C 36.5 C    Last Pain:  Vitals:   08/19/16 1315  TempSrc:   PainSc: 0-No pain   Pain Goal: Patients Stated Pain Goal: 3 (08/19/16 1315)               [name]

## 2016-08-19 NOTE — Progress Notes (Signed)
Pt ambulating without difficulty. Dr. Sabra Heck at bedside. POC discussed and agreed upon. Pt planning to d/c home tonight pending HGB lab result. Toya Smothers, RN

## 2016-08-19 NOTE — Anesthesia Procedure Notes (Signed)
Procedure Name: Intubation Date/Time: 08/19/2016 7:31 AM Performed by: Hewitt Blade Pre-anesthesia Checklist: Patient identified, Emergency Drugs available, Suction available and Patient being monitored Patient Re-evaluated:Patient Re-evaluated prior to inductionOxygen Delivery Method: Circle system utilized Preoxygenation: Pre-oxygenation with 100% oxygen Intubation Type: IV induction Ventilation: Mask ventilation without difficulty Laryngoscope Size: Mac and 3 Grade View: Grade I Tube type: Oral Tube size: 7.0 mm Number of attempts: 1 Airway Equipment and Method: Stylet Placement Confirmation: ETT inserted through vocal cords under direct vision,  positive ETCO2 and breath sounds checked- equal and bilateral Secured at: 21 cm Tube secured with: Tape Dental Injury: Teeth and Oropharynx as per pre-operative assessment

## 2016-08-19 NOTE — Discharge Summary (Signed)
Physician Discharge Summary  Patient ID: EVERETTE COSTENBADER MRN: [id number] DOB/AGE: July 14, 1966 50 y.o.  Admit date: 08/19/2016 Discharge date: 08/19/2016  Admission Diagnoses:  PMP bleeding, recurrent endometrial polyps  Discharge Diagnoses:  Active Problems:   * No active hospital problems. *   Discharged Condition: good  Hospital Course: Patient admitted through same day surgery.  She was taken to OR where TLH/bilateral salpingectomy/cystoscopy were performed.  Surgical findings included normal pelvis and upper abdomen.  Surgery was uneventful.  EBL 25cc.  Foley catheter was removed before leaving OR.  Patient transferred to PACU where she was stable and then to 3rd floor for the remainder of her hospitalization.  During her post-op recovery, her vitals and stable and she was AF.  In evening of POD#0, she was able to transition to oral pain medications and regular diet.  She was able to ambulate and she had good pain control.  She was also able to void on her own.  Patient seen in the evening of POD#0 and she desired to go home if possible.  She was meeting all criteria for discharge.  VSS/AF and normal exam including soft abdomen with good bowel sounds.  Hb was ordered and was 11.6 compared with 13.1 pre-operatively.  I felt discharge was appropriate.    Consults: None  Significant Diagnostic Studies: labs: post op hb 11.6  Treatments: surgery: TLH/bilateral salpingectomy/cytsoscopy  Discharge Exam: Blood pressure (!) 96/46, pulse (!) 51, temperature 98.2 F (36.8 C), resp. rate 16, height 5\' 2"  (1.575 m), weight 136 lb (61.7 kg), last menstrual period 08/16/2015, SpO2 98 %. General appearance: alert and cooperative Resp: clear to auscultation bilaterally Cardio: regular rate and rhythm, S1, S2 normal, no murmur, click, rub or gallop GI: soft, non-tender; bowel sounds normal; no masses,  no organomegaly Extremities: extremities normal, atraumatic, no cyanosis or  edema Incision/Wound:C/D/I Bleeding:  none  Disposition: 01-Home or Self Care     Medication List    TAKE these medications   ALPRAZolam 0.25 MG tablet Commonly known as:  XANAX Reported on 10/27/2015   celecoxib 200 MG capsule Commonly known as:  CELEBREX Take 200 mg by mouth daily as needed for moderate pain. Reported on 10/27/2015   ibuprofen 800 MG tablet Commonly known as:  ADVIL,MOTRIN Take 1 tablet (800 mg total) by mouth every 8 (eight) hours as needed.   ONE-A-DAY WOMENS PO Take 1 tablet by mouth daily.   oxyCODONE-acetaminophen 5-325 MG tablet Commonly known as:  PERCOCET Take 2 tablets by mouth every 4 (four) hours as needed. use only as much as needed to relieve pain      Follow-up Information    Pal Shell, Satira Anis, MD Follow up in 1 week(s).   Specialty:  Gynecology Why:  pt already has appt Contact information: [address] [place] [address] [phone]           Signed: Lyman Speller 08/19/2016, 7:34 PM

## 2016-08-19 NOTE — Transfer of Care (Signed)
Immediate Anesthesia Transfer of Care Note  Patient: [name]  Procedure(s) Performed: Procedure(s): HYSTERECTOMY TOTAL LAPAROSCOPIC (N/A) LAPAROSCOPIC BILATERAL SALPINGECTOMY (Bilateral) CYSTOSCOPY (N/A)  Patient Location: PACU  Anesthesia Type:General  Level of Consciousness: awake, alert  and oriented  Airway & Oxygen Therapy: Patient Spontanous Breathing and Patient connected to nasal cannula oxygen  Post-op Assessment: Report given to RN, Post -op Vital signs reviewed and stable and Patient moving all extremities  Post vital signs: Reviewed and stable  Last Vitals:  Vitals:   08/19/16 0618  BP: (!) 105/53  Pulse: 62  Resp: 18  Temp: 36.6 C    Last Pain:  Vitals:   08/19/16 0618  TempSrc: Oral      Patients Stated Pain Goal: 3 ([id number])  Complications: No apparent anesthesia complications

## 2016-08-19 NOTE — Progress Notes (Signed)
Pt vitals signs are within normal limit. Discharge instructions given. Pt is being transported to her vehicle accompanied by a family member.

## 2016-08-20 ENCOUNTER — Encounter (HOSPITAL_COMMUNITY): Payer: Self-pay | Admitting: Obstetrics & Gynecology

## 2016-08-26 ENCOUNTER — Ambulatory Visit (INDEPENDENT_AMBULATORY_CARE_PROVIDER_SITE_OTHER): Payer: BLUE CROSS/BLUE SHIELD | Admitting: Obstetrics & Gynecology

## 2016-08-26 ENCOUNTER — Encounter: Payer: Self-pay | Admitting: Obstetrics & Gynecology

## 2016-08-26 VITALS — BP 104/62 | HR 74 | Resp 14 | Ht 62.0 in | Wt 134.0 lb

## 2016-08-26 DIAGNOSIS — Z9889 Other specified postprocedural states: Secondary | ICD-10-CM

## 2016-08-26 NOTE — Progress Notes (Signed)
Post Operative Visit  Procedure: HYSTERECTOMY TOTAL LAPAROSCOPIC,  LAPAROSCOPIC BILATERAL SALPINGECTOMY,  CYSTOSCOPY Days Post-op:  7 days  Subjective: Doing very well.  Denies vaginal bleeding.  Having regular BMs.  Took no narcotics post op.  Hasn't taken ibuprofen in three to four days.  Emptying bladder without difficulty.  No vaginal discharge or odor.  Denies fever.  Pathology reviewed.  Questions answered.  Objective: BP 104/62 (BP Location: Right Arm, Patient Position: Sitting, Cuff Size: Normal)   Pulse 74   Resp 14   Ht 5\' 2"  (1.575 m)   Wt 134 lb (60.8 kg)   LMP 08/16/2015   BMI 24.51 kg/m   EXAM General: alert and cooperative Resp: clear to auscultation bilaterally Cardio: regular rate and rhythm, S1, S2 normal, no murmur, click, rub or gallop GI: soft, non-tender; bowel sounds normal; no masses,  no organomegaly and incision: clean, dry and intact Extremities: extremities normal, atraumatic, no cyanosis or edema Vaginal Bleeding: none  Assessment: s/p TLH/bilateral salpingectomy/cystoscopy  Plan: Recheck 3 weeks.  Do's and don'ts reviewed.  Questions answered.

## 2016-09-02 ENCOUNTER — Telehealth: Payer: Self-pay | Admitting: Obstetrics & Gynecology

## 2016-09-02 NOTE — Telephone Encounter (Signed)
Spoke with Denny Peon from [facility]. Denny Peon states patient will be a new patient as she was last seen in 2006. Referral form will need to be completed. Referral form to be faxed to the office for completion and return prior to patient scheduling.

## 2016-09-02 NOTE — Telephone Encounter (Signed)
Spoke with patient. Advised of message as seen below from [place]. Patient is agreeable. Advised I will contact Dr.Hirsch's office and return call with appointment date and time. Call to [facility]. Left voicemail for return call to the office to help in facilitating new patient appointment.

## 2016-09-02 NOTE — Telephone Encounter (Signed)
Spoke with patient. Patient had HYSTERECTOMY TOTAL LAPAROSCOPIC,LAPAROSCOPIC BILATERAL SALPINGECTOMY,CYSTOSCOPY on 08/19/2016. Reports having arm pain that she discussed with Dr.Miller at her post op appointment on 08/26/2016. Has restarted her Celebrex. Pain has increased. Feels it is nerve related. Pain is intermittent and is a 10/10 at the worst. Currently a 5/10. Has taken 800 mg of Ibuprofen with slight relief. States she is unable to sleep due to pain with laying down and is unable to lift her arm. Requesting a referral to see Dr.James Luiz Ochoa who performed her cervical discectomy. Advised I will speak with Dr.Miller and return call with further recommendations.

## 2016-09-02 NOTE — Telephone Encounter (Signed)
Yes, this is fine.  She was having a little shoulder pain the other day when I saw her.  Please call and see if they can see her ASAP.

## 2016-09-02 NOTE — Telephone Encounter (Signed)
Patient had surgery on 08/19/16 and is still having the same issue.

## 2016-09-02 NOTE — Telephone Encounter (Signed)
Referral form received. Completed form and insurance card faxed to [facility] at [phone] with cover sheet and confirmation.

## 2016-09-02 NOTE — Telephone Encounter (Signed)
Spoke with Denny Peon at [facility]. Appointment scheduled for first available 09/12/2016 at 10:30 am with Dr.Pool. Spoke with patient. Advised of appointment date and time. Patient is agreeable to see another provider as this was the first available appointment. Aware if her symptoms worsen she will need to be seen at local ER or contact [facility] for emergent appointment. Patient is agreeable and verbalizes understanding.  Dr.Miller, anything further for this patient?

## 2016-09-05 NOTE — Telephone Encounter (Signed)
Can you check on this pt?

## 2016-09-05 NOTE — Telephone Encounter (Signed)
Call to patient. States arm pain is no better but no worse. She is taking Celebrex daily and using heat. Appointment has been moved up to 09-09-16.  She states pain is worse at night and she has trouble lifting her arm. States from GYN standpoint she is doing great. Her incisions are so well healed they appear to be "gone." States has no bleeding and feels she is doing very well.  States thanks Dr Sabra Heck for call to check on her.  Routing to provider for final review.  Will close encounter.

## 2016-09-09 DIAGNOSIS — R030 Elevated blood-pressure reading, without diagnosis of hypertension: Secondary | ICD-10-CM | POA: Diagnosis not present

## 2016-09-09 DIAGNOSIS — S46012A Strain of muscle(s) and tendon(s) of the rotator cuff of left shoulder, initial encounter: Secondary | ICD-10-CM | POA: Diagnosis not present

## 2016-09-18 ENCOUNTER — Telehealth: Payer: Self-pay | Admitting: Obstetrics & Gynecology

## 2016-09-18 ENCOUNTER — Other Ambulatory Visit: Payer: Self-pay | Admitting: Family Medicine

## 2016-09-18 DIAGNOSIS — Z1231 Encounter for screening mammogram for malignant neoplasm of breast: Secondary | ICD-10-CM

## 2016-09-18 NOTE — Telephone Encounter (Signed)
Patient wants to speak with a nurse about an update on an appointment she had.  Also wants dr Sabra Heck to write he a script for Celebrex.

## 2016-09-18 NOTE — Telephone Encounter (Signed)
I will defer to Dr. Sabra Heck for this prescription.  In the mean time, the patient can take over the counter Ibuprofen 800 mg every 8 hours if needed. Eat food first.  Cc- Dr. Sabra Heck

## 2016-09-18 NOTE — Telephone Encounter (Signed)
Spoke with patient, advised as seen below per Dr. Quincy Simmonds. Patient states she is currently taking Ibuprofen that she had left from surgery. Patient states she called pharmacy to see if she had additional refills left from original prescription written by Dr. Stephanie Acre. Patient states the refills had expired in 2016. Patient states pharmacy was going to request a refill from Dr. Stephanie Acre. Patient states she has not been seen by pcp recently so request will likely be denied. Patient states she would still like to review with Dr. Sabra Heck. Patient states will return call if filled by Dr. Stephanie Acre. Advised patient will review with Dr. Sabra Heck 12/28 and return call with recommendations, patient is agreeable.  Dr. Sabra Heck, please advise Cc: Dr. Quincy Simmonds

## 2016-09-18 NOTE — Telephone Encounter (Signed)
Spoke with patient. Patient states she is following up with pain in left arm that she has been experiencing since Hysterectomy 08/19/16. Patient states she was seen by Neurosurgeon Dr. Trenton Gammon on 12/18 and was prescribed 1 week of prednisone. Patient states the prednisone did not help and she has scheduled an appointment with [facility]- Dr. Tamera Punt who is a shoulder specialist 09/25/16 at 3pm. Patient states she is out of the Celebrex 200 mg  that she has been taking daily for pain and has no refills. Patient states she is aware that Dr. Sabra Heck is not the original physician to prescribe the medication, but she now needs it d/t the pain in left arm  until she can be seen by Dr. Tamera Punt. Patient states was ok by Dr. Sabra Heck for me to continue taking the Celebrex. Advised patient Dr. Sabra Heck is out of the office today will review with covering provider for recommendations and return call, patient is agreeable. See also telephone encounter dated 09/02/16.  Dr. Quincy Simmonds, please advise on Celebrex?  Cc: Dr. Sabra Heck

## 2016-09-19 ENCOUNTER — Other Ambulatory Visit: Payer: Self-pay | Admitting: Obstetrics & Gynecology

## 2016-09-19 MED ORDER — CELECOXIB 200 MG PO CAPS: 200.0000 mg | ORAL_CAPSULE | Freq: Every day | ORAL | 0 refills | Status: AC | PRN

## 2016-09-19 NOTE — Telephone Encounter (Signed)
Spoke with patient, advised as seen below per Dr. Miller. Patient verbalizes understanding and is agreeable.  Routing to provider for final review. Patient is agreeable to disposition. Will close encounter.  

## 2016-09-19 NOTE — Telephone Encounter (Signed)
Rx for celebrex 200mg  daily sent to pharmacy.  #30/0RF.

## 2016-09-25 DIAGNOSIS — M67912 Unspecified disorder of synovium and tendon, left shoulder: Secondary | ICD-10-CM | POA: Diagnosis not present

## 2016-09-27 ENCOUNTER — Encounter: Payer: Self-pay | Admitting: Obstetrics & Gynecology

## 2016-09-27 ENCOUNTER — Ambulatory Visit (INDEPENDENT_AMBULATORY_CARE_PROVIDER_SITE_OTHER): Payer: BLUE CROSS/BLUE SHIELD | Admitting: Obstetrics & Gynecology

## 2016-09-27 VITALS — BP 116/60 | HR 72 | Resp 16 | Wt 136.0 lb

## 2016-09-27 DIAGNOSIS — Z9889 Other specified postprocedural states: Secondary | ICD-10-CM

## 2016-09-27 NOTE — Progress Notes (Signed)
Post Operative Visit  Procedure: HYSTERECTOMY TOTAL LAPAROSCOPIC,  LAPAROSCOPIC BILATERAL SALPINGECTOMY Days Post-op:  5 weeks, 4 days  Subjective: Doing well from surgical standpoint.  No vaginal bleeding.  Did not have constipation.  Voiding well.    Saw PA in Dr. Berenice Primas on Wednesday.  She did have an x-ray.  She ended up getting a steroid injection in the joint.  She did see neurosurgery and was on a steroid taper.  This didn't do anything.  The injection was very painful for the injection but her shoulder is feeling much better.  She is to call on Monday and then may need to start PT.  Will decide on Monday if needs MRI as well.  Objective: BP 116/60 (BP Location: Right Arm, Patient Position: Sitting, Cuff Size: Normal)   Pulse 72   Resp 16   Wt 136 lb (61.7 kg)   LMP 08/16/2015   BMI 24.87 kg/m   EXAM General: alert and cooperative Resp: clear to auscultation bilaterally Cardio: regular rate and rhythm, S1, S2 normal, no murmur, click, rub or gallop GI: soft, non-tender; bowel sounds normal; no masses,  no organomegaly and incision: clean, dry and intact Extremities: extremities normal, atraumatic, no cyanosis or edema  Gyn:  NAEFG, vaginal normal, cuff well approximated, no masses/fullness,  Vaginal Bleeding: none  Assessment: s/p TLH/bilateral salpingectomy, cystoscopy  Plan: Recheck 1 year Pt is released to return to normal activity.  She is aware to have pelvic restart for 8 weeks.

## 2016-10-02 ENCOUNTER — Telehealth: Payer: Self-pay

## 2016-10-02 DIAGNOSIS — M25512 Pain in left shoulder: Secondary | ICD-10-CM | POA: Diagnosis not present

## 2016-10-02 DIAGNOSIS — M67912 Unspecified disorder of synovium and tendon, left shoulder: Secondary | ICD-10-CM | POA: Diagnosis not present

## 2016-10-02 NOTE — Telephone Encounter (Signed)
Patient came into the office today to provide update on the pain in her shoulder. Reports she had her first PT appointment today and did well. Patient was advised to continue her Celebrex at 200 mg daily for 1 more week to see if this will help to provide relief. Patient is going to need to be seen by PT 2-3 times per week. Patient is due to return from work following surgery with [facility] on 10/10/2016. Patient would still like to return to work on this date, but is asking if Dr.Miller can write a new return to work letter stating she will need to have PT 2-3 times per week so that her work schedule can be adjusted accordingly. Would like a copy of the letter sent via MyChart and original mailed to her home. Advised I will review this with Dr.Miller upon her return to the office tomorrow and return call with further recommendations.

## 2016-10-03 DIAGNOSIS — M25512 Pain in left shoulder: Secondary | ICD-10-CM | POA: Diagnosis not present

## 2016-10-03 DIAGNOSIS — M67912 Unspecified disorder of synovium and tendon, left shoulder: Secondary | ICD-10-CM | POA: Diagnosis not present

## 2016-10-04 ENCOUNTER — Encounter: Payer: Self-pay | Admitting: Obstetrics & Gynecology

## 2016-10-04 DIAGNOSIS — M67912 Unspecified disorder of synovium and tendon, left shoulder: Secondary | ICD-10-CM | POA: Diagnosis not present

## 2016-10-04 DIAGNOSIS — M25512 Pain in left shoulder: Secondary | ICD-10-CM | POA: Diagnosis not present

## 2016-10-04 NOTE — Telephone Encounter (Signed)
Patient has been notified that letter has been written by [place] and sent via [facility]. Signed letter will be mailed to home address on file. Patient verbalizes understanding and is very Patent attorney. Letter mailed to verified patient address on file.  Will close encounter.

## 2016-10-04 NOTE — Telephone Encounter (Signed)
Letter written and sent via my chart.  There is one that is printed as well.  I will sign it and it can be mailed.  Thanks.

## 2016-10-08 DIAGNOSIS — M67912 Unspecified disorder of synovium and tendon, left shoulder: Secondary | ICD-10-CM | POA: Diagnosis not present

## 2016-10-08 DIAGNOSIS — M25512 Pain in left shoulder: Secondary | ICD-10-CM | POA: Diagnosis not present

## 2016-10-11 DIAGNOSIS — M25512 Pain in left shoulder: Secondary | ICD-10-CM | POA: Diagnosis not present

## 2016-10-11 DIAGNOSIS — M67912 Unspecified disorder of synovium and tendon, left shoulder: Secondary | ICD-10-CM | POA: Diagnosis not present

## 2016-10-14 DIAGNOSIS — M25512 Pain in left shoulder: Secondary | ICD-10-CM | POA: Diagnosis not present

## 2016-10-14 DIAGNOSIS — M67912 Unspecified disorder of synovium and tendon, left shoulder: Secondary | ICD-10-CM | POA: Diagnosis not present

## 2016-10-16 ENCOUNTER — Ambulatory Visit: Payer: BLUE CROSS/BLUE SHIELD

## 2016-10-16 ENCOUNTER — Ambulatory Visit
Admission: RE | Admit: 2016-10-16 | Discharge: 2016-10-16 | Disposition: A | Discharge status: Home / Self Care | Payer: BLUE CROSS/BLUE SHIELD | Source: Ambulatory Visit | Attending: Family Medicine | Admitting: Family Medicine

## 2016-10-16 DIAGNOSIS — Z1231 Encounter for screening mammogram for malignant neoplasm of breast: Secondary | ICD-10-CM | POA: Diagnosis not present

## 2016-10-17 DIAGNOSIS — M25512 Pain in left shoulder: Secondary | ICD-10-CM | POA: Diagnosis not present

## 2016-10-17 DIAGNOSIS — M67912 Unspecified disorder of synovium and tendon, left shoulder: Secondary | ICD-10-CM | POA: Diagnosis not present

## 2016-10-18 DIAGNOSIS — M25512 Pain in left shoulder: Secondary | ICD-10-CM | POA: Diagnosis not present

## 2016-10-22 ENCOUNTER — Telehealth: Payer: Self-pay | Admitting: Obstetrics & Gynecology

## 2016-10-22 NOTE — Telephone Encounter (Signed)
Patient called and said she'd like to give the nurse an update on what's has been going on with her since her post op appointment on 09/27/16.

## 2016-10-22 NOTE — Telephone Encounter (Signed)
Spoke with patient. Patient is calling to let Dr.Miller know that she has been having ongoing shoulder pain following Cortisone injection and PT. Took last PT class on 10/17/2016. Patient had an MRI on 10/18/2016 and is scheduled to see Dr.Cornelius tomorrow 10/23/2016 at 2:30 pm to review results and plan of care. States she has recovered well and feels great following surgery with Dr.Miller in 07/2016. "I just wanted to keep her in the loop on the other problem I am having." Advised I will let Dr.Miller know and if she has any questions of concerns to contact the office. Patient is agreeable.  Routing to provider for final review. Patient agreeable to disposition. Will close encounter.

## 2016-10-23 ENCOUNTER — Telehealth: Payer: Self-pay

## 2016-10-23 DIAGNOSIS — M75112 Incomplete rotator cuff tear or rupture of left shoulder, not specified as traumatic: Secondary | ICD-10-CM | POA: Diagnosis not present

## 2016-10-23 NOTE — Telephone Encounter (Signed)
Patient came in to office today wanting to speak with RN. Patient was seen with Dr.Cornelius today to discuss MRI results. Reports she has a 80 %-90 % tear of her rotator cuff and will be having surgery to repair this soon. Wanted to let Dr.Miller know of results and next steps in care as FYI. Advised I will relay this information to [place]. Patient will have MRI results faxed to the office to be placed in her chart. Will call with any questions.  Routing to provider for final review. Patient agreeable to disposition. Will close encounter.

## 2016-10-30 ENCOUNTER — Telehealth: Payer: Self-pay | Admitting: Obstetrics & Gynecology

## 2016-10-30 NOTE — Telephone Encounter (Signed)
Anesthesia record from surgery date 08-19-16 printed and sent to Dr Sabra Heck for review

## 2016-10-30 NOTE — Telephone Encounter (Signed)
Patient called wanting to know what anesthesia was used for her surgery on 08/19/17.  She'd like to have the same anesthesia at her next surgery coming up.

## 2016-11-03 NOTE — Telephone Encounter (Signed)
OK to send pt records she's requested.  Thanks.

## 2016-11-04 NOTE — Telephone Encounter (Signed)
Call to patient. Left message to call back.  

## 2016-11-04 NOTE — Telephone Encounter (Signed)
Patient is returning a call to Sally. °

## 2016-11-04 NOTE — Telephone Encounter (Signed)
Return call to patient. Patient having surgery with Dr Tamera Punt at [facility] on 11-12-16. Patient requests we forward records to them for this surgery. Advised we can call there and confirm surgery date and fax number and then send records to allow for continuation of her care.

## 2016-11-04 NOTE — Telephone Encounter (Signed)
Called the [facility] and spoke with Jana Half in surgery scheduling.  Jana Half confirmed the patient is scheduled at their facility for surgery with Dr. Tamera Punt on 11/12/2016.  I further informed Jana Half that the patient requested we send them her anesthesia records from her last surgery with our office.  Fax number confirmed twice and records faxed to: [phone].  Closing encounter and routing to [place] for final review.

## 2016-11-12 DIAGNOSIS — M75112 Incomplete rotator cuff tear or rupture of left shoulder, not specified as traumatic: Secondary | ICD-10-CM | POA: Diagnosis not present

## 2016-11-12 DIAGNOSIS — G8918 Other acute postprocedural pain: Secondary | ICD-10-CM | POA: Diagnosis not present

## 2016-11-12 DIAGNOSIS — M24112 Other articular cartilage disorders, left shoulder: Secondary | ICD-10-CM | POA: Diagnosis not present

## 2016-11-12 DIAGNOSIS — M7541 Impingement syndrome of right shoulder: Secondary | ICD-10-CM | POA: Diagnosis not present

## 2016-11-12 DIAGNOSIS — M7542 Impingement syndrome of left shoulder: Secondary | ICD-10-CM | POA: Diagnosis not present

## 2016-11-12 DIAGNOSIS — S43492A Other sprain of left shoulder joint, initial encounter: Secondary | ICD-10-CM | POA: Diagnosis not present

## 2016-11-12 DIAGNOSIS — M24111 Other articular cartilage disorders, right shoulder: Secondary | ICD-10-CM | POA: Diagnosis not present

## 2016-11-20 DIAGNOSIS — Z9889 Other specified postprocedural states: Secondary | ICD-10-CM | POA: Diagnosis not present

## 2016-11-25 DIAGNOSIS — M75112 Incomplete rotator cuff tear or rupture of left shoulder, not specified as traumatic: Secondary | ICD-10-CM | POA: Diagnosis not present

## 2016-11-25 DIAGNOSIS — M25512 Pain in left shoulder: Secondary | ICD-10-CM | POA: Diagnosis not present

## 2016-11-27 DIAGNOSIS — M75112 Incomplete rotator cuff tear or rupture of left shoulder, not specified as traumatic: Secondary | ICD-10-CM | POA: Diagnosis not present

## 2016-11-27 DIAGNOSIS — M25512 Pain in left shoulder: Secondary | ICD-10-CM | POA: Diagnosis not present

## 2016-12-02 DIAGNOSIS — M25512 Pain in left shoulder: Secondary | ICD-10-CM | POA: Diagnosis not present

## 2016-12-02 DIAGNOSIS — M75112 Incomplete rotator cuff tear or rupture of left shoulder, not specified as traumatic: Secondary | ICD-10-CM | POA: Diagnosis not present

## 2016-12-05 DIAGNOSIS — M75112 Incomplete rotator cuff tear or rupture of left shoulder, not specified as traumatic: Secondary | ICD-10-CM | POA: Diagnosis not present

## 2016-12-05 DIAGNOSIS — M25512 Pain in left shoulder: Secondary | ICD-10-CM | POA: Diagnosis not present

## 2016-12-09 DIAGNOSIS — M75112 Incomplete rotator cuff tear or rupture of left shoulder, not specified as traumatic: Secondary | ICD-10-CM | POA: Diagnosis not present

## 2016-12-09 DIAGNOSIS — M25512 Pain in left shoulder: Secondary | ICD-10-CM | POA: Diagnosis not present

## 2016-12-10 DIAGNOSIS — M75112 Incomplete rotator cuff tear or rupture of left shoulder, not specified as traumatic: Secondary | ICD-10-CM | POA: Diagnosis not present

## 2016-12-10 DIAGNOSIS — M25512 Pain in left shoulder: Secondary | ICD-10-CM | POA: Diagnosis not present

## 2016-12-16 DIAGNOSIS — M25512 Pain in left shoulder: Secondary | ICD-10-CM | POA: Diagnosis not present

## 2016-12-16 DIAGNOSIS — M75112 Incomplete rotator cuff tear or rupture of left shoulder, not specified as traumatic: Secondary | ICD-10-CM | POA: Diagnosis not present

## 2016-12-17 DIAGNOSIS — M75112 Incomplete rotator cuff tear or rupture of left shoulder, not specified as traumatic: Secondary | ICD-10-CM | POA: Diagnosis not present

## 2016-12-17 DIAGNOSIS — M25512 Pain in left shoulder: Secondary | ICD-10-CM | POA: Diagnosis not present

## 2016-12-18 DIAGNOSIS — Z9889 Other specified postprocedural states: Secondary | ICD-10-CM | POA: Diagnosis not present

## 2016-12-23 DIAGNOSIS — M25512 Pain in left shoulder: Secondary | ICD-10-CM | POA: Diagnosis not present

## 2016-12-23 DIAGNOSIS — M75112 Incomplete rotator cuff tear or rupture of left shoulder, not specified as traumatic: Secondary | ICD-10-CM | POA: Diagnosis not present

## 2016-12-26 DIAGNOSIS — M25512 Pain in left shoulder: Secondary | ICD-10-CM | POA: Diagnosis not present

## 2016-12-26 DIAGNOSIS — M75112 Incomplete rotator cuff tear or rupture of left shoulder, not specified as traumatic: Secondary | ICD-10-CM | POA: Diagnosis not present

## 2016-12-30 DIAGNOSIS — M25512 Pain in left shoulder: Secondary | ICD-10-CM | POA: Diagnosis not present

## 2016-12-30 DIAGNOSIS — M75112 Incomplete rotator cuff tear or rupture of left shoulder, not specified as traumatic: Secondary | ICD-10-CM | POA: Diagnosis not present

## 2017-01-02 DIAGNOSIS — M25512 Pain in left shoulder: Secondary | ICD-10-CM | POA: Diagnosis not present

## 2017-01-02 DIAGNOSIS — M75112 Incomplete rotator cuff tear or rupture of left shoulder, not specified as traumatic: Secondary | ICD-10-CM | POA: Diagnosis not present

## 2017-01-08 DIAGNOSIS — M75112 Incomplete rotator cuff tear or rupture of left shoulder, not specified as traumatic: Secondary | ICD-10-CM | POA: Diagnosis not present

## 2017-01-08 DIAGNOSIS — M25512 Pain in left shoulder: Secondary | ICD-10-CM | POA: Diagnosis not present

## 2017-01-09 DIAGNOSIS — M75112 Incomplete rotator cuff tear or rupture of left shoulder, not specified as traumatic: Secondary | ICD-10-CM | POA: Diagnosis not present

## 2017-01-09 DIAGNOSIS — M25512 Pain in left shoulder: Secondary | ICD-10-CM | POA: Diagnosis not present

## 2017-01-14 DIAGNOSIS — M25512 Pain in left shoulder: Secondary | ICD-10-CM | POA: Diagnosis not present

## 2017-01-14 DIAGNOSIS — M75112 Incomplete rotator cuff tear or rupture of left shoulder, not specified as traumatic: Secondary | ICD-10-CM | POA: Diagnosis not present

## 2017-01-16 DIAGNOSIS — M25512 Pain in left shoulder: Secondary | ICD-10-CM | POA: Diagnosis not present

## 2017-01-16 DIAGNOSIS — M75112 Incomplete rotator cuff tear or rupture of left shoulder, not specified as traumatic: Secondary | ICD-10-CM | POA: Diagnosis not present

## 2017-01-20 DIAGNOSIS — M25512 Pain in left shoulder: Secondary | ICD-10-CM | POA: Diagnosis not present

## 2017-01-20 DIAGNOSIS — M75112 Incomplete rotator cuff tear or rupture of left shoulder, not specified as traumatic: Secondary | ICD-10-CM | POA: Diagnosis not present

## 2017-01-23 DIAGNOSIS — M25512 Pain in left shoulder: Secondary | ICD-10-CM | POA: Diagnosis not present

## 2017-01-23 DIAGNOSIS — M75112 Incomplete rotator cuff tear or rupture of left shoulder, not specified as traumatic: Secondary | ICD-10-CM | POA: Diagnosis not present

## 2017-01-30 DIAGNOSIS — M75112 Incomplete rotator cuff tear or rupture of left shoulder, not specified as traumatic: Secondary | ICD-10-CM | POA: Diagnosis not present

## 2017-01-30 DIAGNOSIS — M25512 Pain in left shoulder: Secondary | ICD-10-CM | POA: Diagnosis not present

## 2017-02-03 DIAGNOSIS — M25512 Pain in left shoulder: Secondary | ICD-10-CM | POA: Diagnosis not present

## 2017-02-03 DIAGNOSIS — M75112 Incomplete rotator cuff tear or rupture of left shoulder, not specified as traumatic: Secondary | ICD-10-CM | POA: Diagnosis not present

## 2017-02-10 DIAGNOSIS — M75112 Incomplete rotator cuff tear or rupture of left shoulder, not specified as traumatic: Secondary | ICD-10-CM | POA: Diagnosis not present

## 2017-02-10 DIAGNOSIS — M25512 Pain in left shoulder: Secondary | ICD-10-CM | POA: Diagnosis not present

## 2017-02-18 DIAGNOSIS — M75112 Incomplete rotator cuff tear or rupture of left shoulder, not specified as traumatic: Secondary | ICD-10-CM | POA: Diagnosis not present

## 2017-02-18 DIAGNOSIS — M25512 Pain in left shoulder: Secondary | ICD-10-CM | POA: Diagnosis not present

## 2017-02-24 DIAGNOSIS — M25512 Pain in left shoulder: Secondary | ICD-10-CM | POA: Diagnosis not present

## 2017-02-24 DIAGNOSIS — M75112 Incomplete rotator cuff tear or rupture of left shoulder, not specified as traumatic: Secondary | ICD-10-CM | POA: Diagnosis not present

## 2017-03-13 DIAGNOSIS — R197 Diarrhea, unspecified: Secondary | ICD-10-CM | POA: Diagnosis not present

## 2017-03-14 DIAGNOSIS — R197 Diarrhea, unspecified: Secondary | ICD-10-CM | POA: Diagnosis not present

## 2017-03-19 DIAGNOSIS — M25512 Pain in left shoulder: Secondary | ICD-10-CM | POA: Diagnosis not present

## 2017-03-29 DIAGNOSIS — H00029 Hordeolum internum unspecified eye, unspecified eyelid: Secondary | ICD-10-CM | POA: Diagnosis not present

## 2017-04-07 DIAGNOSIS — R197 Diarrhea, unspecified: Secondary | ICD-10-CM | POA: Diagnosis not present

## 2017-04-14 DIAGNOSIS — R197 Diarrhea, unspecified: Secondary | ICD-10-CM | POA: Diagnosis not present

## 2017-05-12 DIAGNOSIS — R197 Diarrhea, unspecified: Secondary | ICD-10-CM | POA: Diagnosis not present

## 2017-07-09 DIAGNOSIS — M67912 Unspecified disorder of synovium and tendon, left shoulder: Secondary | ICD-10-CM | POA: Diagnosis not present

## 2017-07-17 DIAGNOSIS — H04123 Dry eye syndrome of bilateral lacrimal glands: Secondary | ICD-10-CM | POA: Diagnosis not present

## 2017-07-17 DIAGNOSIS — H10413 Chronic giant papillary conjunctivitis, bilateral: Secondary | ICD-10-CM | POA: Diagnosis not present

## 2017-08-06 DIAGNOSIS — M67912 Unspecified disorder of synovium and tendon, left shoulder: Secondary | ICD-10-CM | POA: Diagnosis not present

## 2017-08-07 DIAGNOSIS — M25512 Pain in left shoulder: Secondary | ICD-10-CM | POA: Diagnosis not present

## 2017-08-07 DIAGNOSIS — M2550 Pain in unspecified joint: Secondary | ICD-10-CM | POA: Diagnosis not present

## 2017-08-18 DIAGNOSIS — H04123 Dry eye syndrome of bilateral lacrimal glands: Secondary | ICD-10-CM | POA: Diagnosis not present

## 2017-08-18 DIAGNOSIS — H40033 Anatomical narrow angle, bilateral: Secondary | ICD-10-CM | POA: Diagnosis not present

## 2017-08-18 DIAGNOSIS — H10413 Chronic giant papillary conjunctivitis, bilateral: Secondary | ICD-10-CM | POA: Diagnosis not present

## 2017-09-23 DIAGNOSIS — C44310 Basal cell carcinoma of skin of unspecified parts of face: Secondary | ICD-10-CM

## 2017-09-23 DIAGNOSIS — C801 Malignant (primary) neoplasm, unspecified: Secondary | ICD-10-CM

## 2017-09-23 HISTORY — PX: BASAL CELL CARCINOMA EXCISION: SHX1214

## 2017-09-23 HISTORY — DX: Basal cell carcinoma of skin of unspecified parts of face: C44.310

## 2017-09-23 HISTORY — DX: Malignant (primary) neoplasm, unspecified: C80.1

## 2017-09-30 ENCOUNTER — Encounter: Payer: Self-pay | Admitting: Obstetrics & Gynecology

## 2017-09-30 ENCOUNTER — Ambulatory Visit: Payer: BLUE CROSS/BLUE SHIELD | Admitting: Obstetrics & Gynecology

## 2017-09-30 VITALS — BP 102/60 | HR 74 | Resp 16 | Ht 61.75 in | Wt 140.0 lb

## 2017-09-30 DIAGNOSIS — Z01419 Encounter for gynecological examination (general) (routine) without abnormal findings: Secondary | ICD-10-CM | POA: Diagnosis not present

## 2017-09-30 DIAGNOSIS — N6310 Unspecified lump in the right breast, unspecified quadrant: Secondary | ICD-10-CM

## 2017-09-30 NOTE — Progress Notes (Signed)
Scheduled patient while in office for right diagnostic mammogram with ultrasound at the [facility] on 10/07/2017 at 3:10 pm. Patient is agreeable to date and time. Placed in mammogram hold.

## 2017-09-30 NOTE — Patient Instructions (Signed)
Gabapentin/Neurontin.  Dosing:  100mg  nightly for a week, then 200mg  nightly for a week, then (if needed) 300mg  nightly.

## 2017-09-30 NOTE — Progress Notes (Signed)
52 y.o. G0P0000 MarriedCaucasianF here for annual exam.  Having hot flashes, now full blown.  Still with left shoulder issues that she thinks is related to flu shot she received when in hospital for hysterectomy.  Patient's last menstrual period was 08/16/2015.          Sexually active: No.  The current method of family planning is status post hysterectomy.  Exercising: Yes.    gym Smoker:  no  Health Maintenance: Pap:  08/21/15 Neg. HR HPV:neg  History of abnormal Pap:  Yes MMG:  10/16/16 BIRADS1:Neg  Colonoscopy:  10/07/14 polyps. F/u 5 years  BMD:   Never TDaP:  2013 Pneumonia vaccine(s):  n/a Shingrix:   No Hep C testing: 08/21/15 Neg  Screening Labs: PCP   reports that she quit smoking about 5 years ago. She has a 25.00 pack-year smoking history. she has never used smokeless tobacco. She reports that she drinks alcohol. She reports that she does not use drugs.  Past Medical History:  Diagnosis Date  . Arthritis   . Dysmenorrhea   . Hand swelling    right  . Hemorrhoid   . Herniated cervical disc   . Migraines   . PONV (postoperative nausea and vomiting)     Past Surgical History:  Procedure Laterality Date  . BARTHOLIN CYST MARSUPIALIZATION Right 08/18/2013   Procedure: BARTHOLIN CYST MARSUPIALIZATION;  Surgeon: Azalia Bilis, MD;  Location: [facility];  Service: Gynecology;  Laterality: Right;  . CERVICAL DISCECTOMY  12/2004   C 5-6  . COLONOSCOPY    . CYSTOSCOPY N/A 08/19/2016   Procedure: CYSTOSCOPY;  Surgeon: Megan Salon, MD;  Location: [facility];  Service: Gynecology;  Laterality: N/A;  . DILATATION & CURETTAGE/HYSTEROSCOPY WITH MYOSURE N/A 10/09/2015   Procedure: DILATATION & CURETTAGE/HYSTEROSCOPY ;  Surgeon: Megan Salon, MD;  Location: [facility];  Service: Gynecology;  Laterality: N/A;  . HEMORRHOID SURGERY  3/99 & 7/03   X 2  . LAPAROSCOPIC BILATERAL SALPINGECTOMY Bilateral 08/19/2016   Procedure: LAPAROSCOPIC BILATERAL SALPINGECTOMY;  Surgeon: Megan Salon, MD;   Location: [facility];  Service: Gynecology;  Laterality: Bilateral;  . LAPAROSCOPIC HYSTERECTOMY N/A 08/19/2016   Procedure: HYSTERECTOMY TOTAL LAPAROSCOPIC;  Surgeon: Megan Salon, MD;  Location: [facility];  Service: Gynecology;  Laterality: N/A;  . PILONIDAL CYST EXCISION  6/97  . ROTATOR CUFF REPAIR Left    11/12/16  . TYMPANOSTOMY TUBE PLACEMENT      Current Outpatient Medications  Medication Sig Dispense Refill  . ALPRAZolam (XANAX) 0.25 MG tablet Reported on 10/27/2015  0  . celecoxib (CELEBREX) 200 MG capsule Take 1 capsule (200 mg total) by mouth daily as needed for moderate pain. Reported on 10/27/2015 30 capsule 0  . ibuprofen (ADVIL,MOTRIN) 800 MG tablet Take 1 tablet (800 mg total) by mouth every 8 (eight) hours as needed. 30 tablet 0  . Multiple Vitamins-Calcium (ONE-A-DAY WOMENS PO) Take 1 tablet by mouth daily.      No current facility-administered medications for this visit.     Family History  Problem Relation Age of Onset  . Diabetes Mother   . Aneurysm Father        abdominal aneurysm  . Aneurysm Paternal Uncle        abdominal aneurysm  . Diabetes Maternal Grandmother   . Diabetes Maternal Grandfather   . Stroke Maternal Grandfather   . Hypertension Maternal Grandfather     ROS:  Pertinent items are noted in HPI.  Otherwise, a comprehensive ROS  was negative.  Exam:   BP 102/60 (BP Location: Right Arm, Patient Position: Sitting, Cuff Size: Normal)   Pulse 74   Resp 16   Ht 5' 1.75" (1.568 m)   Wt 140 lb (63.5 kg)   LMP 08/16/2015   BMI 25.81 kg/m   Weight change: +1#   Height: 5' 1.75" (156.8 cm)  Ht Readings from Last 3 Encounters:  09/30/17 5' 1.75" (1.568 m)  08/26/16 5\' 2"  (1.575 m)  08/19/16 5\' 2"  (1.575 m)    General appearance: alert, cooperative and appears stated age Head: Normocephalic, without obvious abnormality, atraumatic Neck: no adenopathy, supple, symmetrical, trachea midline and thyroid normal to inspection and palpation Lungs: clear to  auscultation bilaterally Breasts: normal appearance, no masses or tenderness, right breast mass noted on exam Heart: regular rate and rhythm Abdomen: soft, non-tender; bowel sounds normal; no masses,  no organomegaly Extremities: extremities normal, atraumatic, no cyanosis or edema Skin: Skin color, texture, turgor normal. No rashes or lesions Lymph nodes: Cervical, supraclavicular, and axillary nodes normal. No abnormal inguinal nodes palpated Neurologic: Grossly normal   Pelvic: External genitalia:  no lesions              Urethra:  normal appearing urethra with no masses, tenderness or lesions              Bartholins and Skenes: normal                 Vagina: normal appearing vagina with normal color and discharge, no lesions              Cervix: absent              Pap taken: No. Bimanual Exam:  Uterus:  uterus absent              Adnexa: normal adnexa and no mass, fullness, tenderness               Rectovaginal: Confirms               Anus:  normal sphincter tone, no lesions  Chaperone was present for exam.  A:  Well Woman with normal exam PMP, no HRT Vasomotor symptoms H/o TLH/bilateral salpingectomy/cystoscopy due to recurrent PMP bleeding 11/17 H/O right bartholin gland cyst with I&D 12/13 then 11/14.  Right breast mass  P:   Mammogram guidelines reviewed pap smear not indicated Trial of gabapentin for hot flashes discussed.  Will try 100mg  x 7 days, then 200mg  x 7 days, then 357m g nightly.  Side effects discussed.  Risks reviewed. Right diagnostic imaging will be scheduled. return annually or prn

## 2017-10-01 ENCOUNTER — Other Ambulatory Visit: Payer: Self-pay | Admitting: Family Medicine

## 2017-10-01 DIAGNOSIS — Z139 Encounter for screening, unspecified: Secondary | ICD-10-CM

## 2017-10-03 ENCOUNTER — Telehealth: Payer: Self-pay | Admitting: Obstetrics & Gynecology

## 2017-10-03 MED ORDER — GABAPENTIN 100 MG PO CAPS: ORAL_CAPSULE | ORAL | 0 refills | Status: DC

## 2017-10-03 NOTE — Telephone Encounter (Addendum)
Spoke with patient. Patient discussed Gabapentin for hot flashes at last AEX 09/30/17 with Dr. Sabra Heck. Patient request RX to [facility].   Instructions reviewed with patient per Dr. Jolyn Lent notes dated 09/30/17: Gabapentin/Neurontin.  Dosing: 100mg  nightly for a week, then 200mg  nightly for a week, then (if needed) 300mg  nightly.  Advised to call with update for future refills. Dr. Sabra Heck will review, I will return call with any additional recommendations, patient is agreeable.   Rx for Gabapentin 100mg  #77/0RF to [facility].    Routing to provider for final review. Patient is agreeable to disposition. Will close encounter.

## 2017-10-03 NOTE — Telephone Encounter (Signed)
Patient calling to speak with nurse about a prescription that was discussed at her recent visit.

## 2017-10-07 ENCOUNTER — Ambulatory Visit
Admission: RE | Admit: 2017-10-07 | Discharge: 2017-10-07 | Disposition: A | Discharge status: Home / Self Care | Payer: BLUE CROSS/BLUE SHIELD | Source: Ambulatory Visit | Attending: Obstetrics & Gynecology | Admitting: Obstetrics & Gynecology

## 2017-10-07 DIAGNOSIS — N6310 Unspecified lump in the right breast, unspecified quadrant: Secondary | ICD-10-CM

## 2017-10-07 DIAGNOSIS — N6489 Other specified disorders of breast: Secondary | ICD-10-CM | POA: Diagnosis not present

## 2017-10-07 DIAGNOSIS — R928 Other abnormal and inconclusive findings on diagnostic imaging of breast: Secondary | ICD-10-CM | POA: Diagnosis not present

## 2017-10-22 ENCOUNTER — Ambulatory Visit
Admission: RE | Admit: 2017-10-22 | Discharge: 2017-10-22 | Disposition: A | Discharge status: Home / Self Care | Payer: BLUE CROSS/BLUE SHIELD | Source: Ambulatory Visit | Attending: Family Medicine | Admitting: Family Medicine

## 2017-10-22 DIAGNOSIS — Z139 Encounter for screening, unspecified: Secondary | ICD-10-CM

## 2017-10-22 DIAGNOSIS — Z1231 Encounter for screening mammogram for malignant neoplasm of breast: Secondary | ICD-10-CM | POA: Diagnosis not present

## 2017-11-25 ENCOUNTER — Ambulatory Visit: Payer: Self-pay | Admitting: Obstetrics & Gynecology

## 2017-11-28 ENCOUNTER — Ambulatory Visit (INDEPENDENT_AMBULATORY_CARE_PROVIDER_SITE_OTHER): Payer: BLUE CROSS/BLUE SHIELD | Admitting: Obstetrics & Gynecology

## 2017-11-28 ENCOUNTER — Encounter: Payer: Self-pay | Admitting: Obstetrics & Gynecology

## 2017-11-28 VITALS — BP 110/60 | HR 68 | Resp 16 | Wt 142.0 lb

## 2017-11-28 DIAGNOSIS — N6310 Unspecified lump in the right breast, unspecified quadrant: Secondary | ICD-10-CM

## 2017-11-28 NOTE — Progress Notes (Signed)
GYNECOLOGY  VISIT  CC:   Recheck right breast liump  HPI: 52 y.o. G0P0000 Married Caucasian female here for breast check.  Right breast mass noted on physical exam 09/30/17.  Denied recent trauma.  Had no breast pain or nipple discharge.  Diagnostic right breast imaging was performed at the [facility] 09/27/17.  She was advised to have breast recheck 6-8 weeks.  Her routine yearly imaging was due very shortly after the diagnostic and she did return for this on 10/22/17.  This was also negative.  She is here for recheck.  Denies any new symptoms.  Can still feel mass.  Denies pain or nipple discharge.  Has no skin changes or bruises.    GYNECOLOGIC HISTORY: Patient's last menstrual period was 08/16/2015. Contraception: hysterectomy  Menopausal hormone therapy: none  Patient Active Problem List   Diagnosis Date Noted  . Bartholin cyst 08/17/2013    Past Medical History:  Diagnosis Date  . Arthritis   . Dysmenorrhea   . Hand swelling    right  . Hemorrhoid   . Herniated cervical disc   . Migraines   . PONV (postoperative nausea and vomiting)     Past Surgical History:  Procedure Laterality Date  . ABDOMINAL HYSTERECTOMY  07/2016  . BARTHOLIN CYST MARSUPIALIZATION Right 08/18/2013   Procedure: BARTHOLIN CYST MARSUPIALIZATION;  Surgeon: Azalia Bilis, MD;  Location: [facility];  Service: Gynecology;  Laterality: Right;  . CERVICAL DISCECTOMY  12/2004   C 5-6  . COLONOSCOPY    . CYSTOSCOPY N/A 08/19/2016   Procedure: CYSTOSCOPY;  Surgeon: Megan Salon, MD;  Location: [facility];  Service: Gynecology;  Laterality: N/A;  . DILATATION & CURETTAGE/HYSTEROSCOPY WITH MYOSURE N/A 10/09/2015   Procedure: DILATATION & CURETTAGE/HYSTEROSCOPY ;  Surgeon: Megan Salon, MD;  Location: [facility];  Service: Gynecology;  Laterality: N/A;  . HEMORRHOID SURGERY  3/99 & 7/03   X 2  . LAPAROSCOPIC BILATERAL SALPINGECTOMY Bilateral 08/19/2016   Procedure: LAPAROSCOPIC BILATERAL SALPINGECTOMY;  Surgeon: Megan Salon, MD;  Location: [facility];  Service: Gynecology;  Laterality: Bilateral;  . LAPAROSCOPIC HYSTERECTOMY N/A 08/19/2016   Procedure: HYSTERECTOMY TOTAL LAPAROSCOPIC;  Surgeon: Megan Salon, MD;  Location: [facility];  Service: Gynecology;  Laterality: N/A;  . PILONIDAL CYST EXCISION  6/97  . ROTATOR CUFF REPAIR Left    11/12/16  . TYMPANOSTOMY TUBE PLACEMENT      MEDS:   Current Outpatient Medications on File Prior to Visit  Medication Sig Dispense Refill  . ALPRAZolam (XANAX) 0.25 MG tablet Reported on 10/27/2015  0  . celecoxib (CELEBREX) 200 MG capsule Take 1 capsule (200 mg total) by mouth daily as needed for moderate pain. Reported on 10/27/2015 30 capsule 0  . gabapentin (NEURONTIN) 100 MG capsule 100mg  nightly x7 nights, then 200mg  nightly x7 nights, then 300mg  nightly x7 nights 77 capsule 0  . Multiple Vitamins-Calcium (ONE-A-DAY WOMENS PO) Take 1 tablet by mouth daily.      No current facility-administered medications on file prior to visit.     ALLERGIES: Prilosec [omeprazole] and Vicodin [hydrocodone-acetaminophen]  Family History  Problem Relation Age of Onset  . Diabetes Mother   . Aneurysm Father        abdominal aneurysm  . Aneurysm Paternal Uncle        abdominal aneurysm  . Diabetes Maternal Grandmother   . Diabetes Maternal Grandfather   . Stroke Maternal Grandfather   . Hypertension Maternal Grandfather     SH:  Married, non smoker  Review of Systems  All other systems reviewed and are negative.   PHYSICAL EXAMINATION:    BP 110/60 (BP Location: Right Arm, Patient Position: Sitting, Cuff Size: Normal)   Pulse 68   Resp 16   Wt 142 lb (64.4 kg)   LMP 08/16/2015   BMI 26.18 kg/m     Physical Exam  Constitutional: She appears well-developed and well-nourished.  HENT:  Head: Normocephalic and atraumatic.  Neck: Normal range of motion. Neck supple. No thyromegaly present.  Respiratory: Right breast exhibits mass. Right breast exhibits no inverted  nipple, no nipple discharge, no skin change and no tenderness. Left breast exhibits no inverted nipple, no mass, no nipple discharge, no skin change and no tenderness. Breasts are symmetrical.      Chaperone was present for exam.  Assessment: Persistent breast mass  Plan: As was new at her routine exam in January and is still persistent, I am going to refer to general surgery for consultation and consideration of excisional biopsy.  Pt and I discussed options and she is in agreement with current plan.   ~15 minutes spent with patient >50% of time was in face to face discussion of above.

## 2017-11-28 NOTE — Progress Notes (Signed)
Left message for [name] new patient coordinator with [facility] for return call to assist with patient scheduling for ongoing palpable right breast mass. Referral placed to Dr.Wakefield. Advised patient will follow up with [facility] next week if I have not received a return call to set appointment up. Patient is agreeable.

## 2017-12-01 ENCOUNTER — Telehealth: Payer: Self-pay | Admitting: Obstetrics & Gynecology

## 2017-12-01 NOTE — Telephone Encounter (Signed)
Left message for Judson Roch with [facility] to call Kennth Vanb[place]oten at [phone].

## 2017-12-01 NOTE — Telephone Encounter (Signed)
Spoke with [name] at [employer],. Appointment scheduled for 12/18/2017 at 9:50 am with Dr.Wakefield for patient. Spoke with patient who is agreeable to date and time.  Cc: Magdalene Patricia  Routing to provider for final review. Patient agreeable to disposition. Will close encounter.

## 2017-12-01 NOTE — Telephone Encounter (Signed)
Judson Roch from [facility] called and left a message after hours on our answering machine stating she was returning Kaitlyn's call about this patient.

## 2017-12-18 DIAGNOSIS — N6019 Diffuse cystic mastopathy of unspecified breast: Secondary | ICD-10-CM | POA: Diagnosis not present

## 2017-12-25 DIAGNOSIS — M25512 Pain in left shoulder: Secondary | ICD-10-CM | POA: Diagnosis not present

## 2017-12-31 DIAGNOSIS — M7502 Adhesive capsulitis of left shoulder: Secondary | ICD-10-CM | POA: Diagnosis not present

## 2017-12-31 DIAGNOSIS — M25612 Stiffness of left shoulder, not elsewhere classified: Secondary | ICD-10-CM | POA: Diagnosis not present

## 2018-01-06 DIAGNOSIS — M7502 Adhesive capsulitis of left shoulder: Secondary | ICD-10-CM | POA: Diagnosis not present

## 2018-01-06 DIAGNOSIS — M25612 Stiffness of left shoulder, not elsewhere classified: Secondary | ICD-10-CM | POA: Diagnosis not present

## 2018-01-15 DIAGNOSIS — M7502 Adhesive capsulitis of left shoulder: Secondary | ICD-10-CM | POA: Diagnosis not present

## 2018-01-15 DIAGNOSIS — M25612 Stiffness of left shoulder, not elsewhere classified: Secondary | ICD-10-CM | POA: Diagnosis not present

## 2018-01-21 ENCOUNTER — Other Ambulatory Visit: Payer: Self-pay | Admitting: Obstetrics & Gynecology

## 2018-01-21 DIAGNOSIS — M7502 Adhesive capsulitis of left shoulder: Secondary | ICD-10-CM | POA: Diagnosis not present

## 2018-01-21 DIAGNOSIS — M25612 Stiffness of left shoulder, not elsewhere classified: Secondary | ICD-10-CM | POA: Diagnosis not present

## 2018-01-21 NOTE — Telephone Encounter (Signed)
Patient said she was given a trial of gabapentin and does like, would like refills. Confirmed pharmacy in file.

## 2018-01-21 NOTE — Telephone Encounter (Signed)
Spoke with patient. Reports gabapentin 100 mg nightly is working well, request refill 90 day supply. Advised will review with Dr. Sabra Heck on 5/2 and return call. Patient agreeable.   Rx pended for Gabapentin 100 mg nightly. #90/2RF.  Dr. Fransico Him to refill until next AEX? Last AEX 09/30/17.

## 2018-01-22 MED ORDER — GABAPENTIN 100 MG PO CAPS: 100.0000 mg | ORAL_CAPSULE | Freq: Every day | ORAL | 2 refills | Status: DC

## 2018-01-22 NOTE — Telephone Encounter (Signed)
Patient notified of RX refill. Encounter Closed.

## 2018-01-29 DIAGNOSIS — M7502 Adhesive capsulitis of left shoulder: Secondary | ICD-10-CM | POA: Diagnosis not present

## 2018-01-29 DIAGNOSIS — M25612 Stiffness of left shoulder, not elsewhere classified: Secondary | ICD-10-CM | POA: Diagnosis not present

## 2018-02-04 DIAGNOSIS — M25612 Stiffness of left shoulder, not elsewhere classified: Secondary | ICD-10-CM | POA: Diagnosis not present

## 2018-04-09 DIAGNOSIS — Z0000 Encounter for general adult medical examination without abnormal findings: Secondary | ICD-10-CM | POA: Diagnosis not present

## 2018-04-09 DIAGNOSIS — Z1159 Encounter for screening for other viral diseases: Secondary | ICD-10-CM | POA: Diagnosis not present

## 2018-04-09 DIAGNOSIS — Z13220 Encounter for screening for lipoid disorders: Secondary | ICD-10-CM | POA: Diagnosis not present

## 2018-05-15 DIAGNOSIS — M25559 Pain in unspecified hip: Secondary | ICD-10-CM | POA: Diagnosis not present

## 2018-05-15 DIAGNOSIS — R7301 Impaired fasting glucose: Secondary | ICD-10-CM | POA: Diagnosis not present

## 2018-07-14 DIAGNOSIS — D485 Neoplasm of uncertain behavior of skin: Secondary | ICD-10-CM | POA: Diagnosis not present

## 2018-07-14 DIAGNOSIS — C44619 Basal cell carcinoma of skin of left upper limb, including shoulder: Secondary | ICD-10-CM | POA: Diagnosis not present

## 2018-07-14 DIAGNOSIS — Z23 Encounter for immunization: Secondary | ICD-10-CM | POA: Diagnosis not present

## 2018-07-24 DIAGNOSIS — C44319 Basal cell carcinoma of skin of other parts of face: Secondary | ICD-10-CM | POA: Diagnosis not present

## 2018-07-24 HISTORY — PX: MOHS SURGERY: SUR867

## 2018-08-18 DIAGNOSIS — H40033 Anatomical narrow angle, bilateral: Secondary | ICD-10-CM | POA: Diagnosis not present

## 2018-09-11 ENCOUNTER — Telehealth: Payer: Self-pay | Admitting: *Deleted

## 2018-09-11 ENCOUNTER — Other Ambulatory Visit: Payer: Self-pay | Admitting: Family Medicine

## 2018-09-11 DIAGNOSIS — Z1231 Encounter for screening mammogram for malignant neoplasm of breast: Secondary | ICD-10-CM

## 2018-09-11 NOTE — Telephone Encounter (Signed)
Left message on voicemail to call and reschedule cancelled appointment. °

## 2018-10-12 ENCOUNTER — Ambulatory Visit: Payer: BLUE CROSS/BLUE SHIELD | Admitting: Obstetrics & Gynecology

## 2018-10-19 DIAGNOSIS — H40033 Anatomical narrow angle, bilateral: Secondary | ICD-10-CM | POA: Diagnosis not present

## 2018-10-24 HISTORY — PX: RETINAL TEAR REPAIR CRYOTHERAPY: SHX5304

## 2018-11-09 ENCOUNTER — Ambulatory Visit
Admission: RE | Admit: 2018-11-09 | Discharge: 2018-11-09 | Disposition: A | Discharge status: Home / Self Care | Payer: BLUE CROSS/BLUE SHIELD | Source: Ambulatory Visit | Attending: Family Medicine | Admitting: Family Medicine

## 2018-11-09 DIAGNOSIS — Z1231 Encounter for screening mammogram for malignant neoplasm of breast: Secondary | ICD-10-CM

## 2018-11-16 ENCOUNTER — Other Ambulatory Visit: Payer: Self-pay

## 2018-11-16 ENCOUNTER — Encounter: Payer: Self-pay | Admitting: Obstetrics & Gynecology

## 2018-11-16 ENCOUNTER — Ambulatory Visit: Payer: BLUE CROSS/BLUE SHIELD | Admitting: Obstetrics & Gynecology

## 2018-11-16 VITALS — BP 122/62 | HR 66 | Resp 14 | Ht 61.5 in | Wt 126.5 lb

## 2018-11-16 DIAGNOSIS — Z01419 Encounter for gynecological examination (general) (routine) without abnormal findings: Secondary | ICD-10-CM | POA: Diagnosis not present

## 2018-11-16 MED ORDER — GABAPENTIN 100 MG PO CAPS: 100.0000 mg | ORAL_CAPSULE | Freq: Every day | ORAL | 3 refills | Status: DC

## 2018-11-16 MED ORDER — ALPRAZOLAM 0.25 MG PO TABS: 0.2500 mg | ORAL_TABLET | ORAL | 0 refills | Status: AC | PRN

## 2018-11-16 NOTE — Progress Notes (Addendum)
53 y.o. G0P0000 Married White or Caucasian female here for annual exam.  Doing well.  Working on weight loss.  Reports last year blood work showed pre-diabetes.  Exercising really regularly at [facility].  Goal for pt is 120#.    Denies vaginal bleeding.    Had eye procedure to deceased eye pressure.  Does not have glaucoma.    Going to go to the opening [place].  She is going to fly from [place] to [place].    PCP:  Dr. Stephanie Acre.  Had blood work done in July.    Patient's last menstrual period was 08/16/2015.          Sexually active: No.  The current method of family planning is status post hysterectomy.    Exercising: Yes.    [employer] 3-4 times a week  Smoker:  Former smoker   Health Maintenance: Pap:  08/21/15 Neg. HR HPV:neg  History of abnormal Pap:  yes MMG:  11/09/18 BIRADS1:neg  Colonoscopy:  10/07/14 f/u 5 years BMD:   no  TDaP:  2013 Pneumonia vaccine(s):  n/a Shingrix:   Not interested in this currently.   Hep C testing: 08/21/15 neg  Screening Labs: PCP   reports that she quit smoking about 7 years ago. She has a 25.00 pack-year smoking history. She has never used smokeless tobacco. She reports current alcohol use. She reports that she does not use drugs.  Past Medical History:  Diagnosis Date  . Arthritis   . Dysmenorrhea   . Hand swelling    right  . Hemorrhoid   . Herniated cervical disc   . Migraines   . PONV (postoperative nausea and vomiting)     Past Surgical History:  Procedure Laterality Date  . ABDOMINAL HYSTERECTOMY  07/2016  . BARTHOLIN CYST MARSUPIALIZATION Right 08/18/2013   Procedure: BARTHOLIN CYST MARSUPIALIZATION;  Surgeon: Azalia Bilis, MD;  Location: [facility];  Service: Gynecology;  Laterality: Right;  . CERVICAL DISCECTOMY  12/2004   C 5-6  . COLONOSCOPY    . CYSTOSCOPY N/A 08/19/2016   Procedure: CYSTOSCOPY;  Surgeon: Megan Salon, MD;  Location: [facility];  Service: Gynecology;  Laterality: N/A;  .  DILATATION & CURETTAGE/HYSTEROSCOPY WITH MYOSURE N/A 10/09/2015   Procedure: DILATATION & CURETTAGE/HYSTEROSCOPY ;  Surgeon: Megan Salon, MD;  Location: [facility];  Service: Gynecology;  Laterality: N/A;  . HEMORRHOID SURGERY  3/99 & 7/03   X 2  . LAPAROSCOPIC BILATERAL SALPINGECTOMY Bilateral 08/19/2016   Procedure: LAPAROSCOPIC BILATERAL SALPINGECTOMY;  Surgeon: Megan Salon, MD;  Location: [facility];  Service: Gynecology;  Laterality: Bilateral;  . LAPAROSCOPIC HYSTERECTOMY N/A 08/19/2016   Procedure: HYSTERECTOMY TOTAL LAPAROSCOPIC;  Surgeon: Megan Salon, MD;  Location: [facility];  Service: Gynecology;  Laterality: N/A;  . PILONIDAL CYST EXCISION  6/97  . ROTATOR CUFF REPAIR Left    11/12/16  . TYMPANOSTOMY TUBE PLACEMENT      Current Outpatient Medications  Medication Sig Dispense Refill  . ALPRAZolam (XANAX) 0.25 MG tablet as needed.   0  . celecoxib (CELEBREX) 200 MG capsule Take 1 capsule (200 mg total) by mouth daily as needed for moderate pain. Reported on 10/27/2015 30 capsule 0  . gabapentin (NEURONTIN) 100 MG capsule Take 1 capsule (100 mg total) by mouth at bedtime. (Patient taking differently: Take 100 mg by mouth as needed. ) 90 capsule 2  . Lancets (ONETOUCH DELICA PLUS [id number]) MISC as needed.     Marland Kitchen  Multiple Vitamins-Calcium (ONE-A-DAY WOMENS PO) Take 1 tablet by mouth daily.     Glory Rosebush VERIO test strip as needed.      No current facility-administered medications for this visit.     Family History  Problem Relation Age of Onset  . Diabetes Mother   . Aneurysm Father        abdominal aneurysm  . Aneurysm Paternal Uncle        abdominal aneurysm  . Diabetes Maternal Grandmother   . Diabetes Maternal Grandfather   . Stroke Maternal Grandfather   . Hypertension Maternal Grandfather   . Breast cancer Neg Hx     Review of Systems  All other systems reviewed and are negative.   Exam:   BP 122/62 (BP Location: Right Arm, Patient Position: Sitting, Cuff Size:  Normal)   Pulse 66   Resp 14   Ht 5' 1.5" (1.562 m)   Wt 126 lb 8 oz (57.4 kg)   LMP 08/16/2015   BMI 23.51 kg/m    Height: 5' 1.5" (156.2 cm)  Ht Readings from Last 3 Encounters:  11/16/18 5' 1.5" (1.562 m)  09/30/17 5' 1.75" (1.568 m)  08/26/16 5\' 2"  (1.575 m)    General appearance: alert, cooperative and appears stated age Head: Normocephalic, without obvious abnormality, atraumatic Neck: no adenopathy, supple, symmetrical, trachea midline and thyroid normal to inspection and palpation Lungs: clear to auscultation bilaterally Breasts: normal appearance, no masses or tenderness Heart: regular rate and rhythm Abdomen: soft, non-tender; bowel sounds normal; no masses,  no organomegaly Extremities: extremities normal, atraumatic, no cyanosis or edema Skin: Skin color, texture, turgor normal. No rashes or lesions Lymph nodes: Cervical, supraclavicular, and axillary nodes normal. No abnormal inguinal nodes palpated Neurologic: Grossly normal   Pelvic: External genitalia:  no lesions              Urethra:  normal appearing urethra with no masses, tenderness or lesions              Bartholins and Skenes: normal                 Vagina: normal appearing vagina with normal color and discharge, no lesions              Cervix: absent              Pap taken: No. Bimanual Exam:  Uterus:  uterus absent              Adnexa: no mass, fullness, tenderness               Rectovaginal: Confirms               Anus:  normal sphincter tone, no lesions  Chaperone was present for exam.  A:  Well Woman with normal exam PMP, no HRT Hot flashes H/o TLH/bilateral salpingectomy due to recurrent PMP bleeding 11/17 H/O right bartholin's gland cyst with E&D 12/13 and then 11/14  P:   Mammogram guidelines reviewed.  Doing yearly. pap smear not indicated due to hysterectomy Rx for gabapentin 100mg  nightly.  Can increase to 300mg  nightly as needed.  #90/4RF. Colonoscopy is UTD return annually or  prn

## 2018-11-17 DIAGNOSIS — H40032 Anatomical narrow angle, left eye: Secondary | ICD-10-CM | POA: Diagnosis not present

## 2019-01-27 DIAGNOSIS — L720 Epidermal cyst: Secondary | ICD-10-CM | POA: Diagnosis not present

## 2019-03-15 DIAGNOSIS — M67442 Ganglion, left hand: Secondary | ICD-10-CM | POA: Diagnosis not present

## 2019-03-23 DIAGNOSIS — I87393 Chronic venous hypertension (idiopathic) with other complications of bilateral lower extremity: Secondary | ICD-10-CM | POA: Diagnosis not present

## 2019-06-21 ENCOUNTER — Other Ambulatory Visit: Payer: Self-pay

## 2019-06-21 ENCOUNTER — Encounter (INDEPENDENT_AMBULATORY_CARE_PROVIDER_SITE_OTHER): Payer: BC Managed Care – PPO | Admitting: Ophthalmology

## 2019-06-21 DIAGNOSIS — H40033 Anatomical narrow angle, bilateral: Secondary | ICD-10-CM | POA: Diagnosis not present

## 2019-06-21 DIAGNOSIS — H43813 Vitreous degeneration, bilateral: Secondary | ICD-10-CM | POA: Diagnosis not present

## 2019-06-21 DIAGNOSIS — H33301 Unspecified retinal break, right eye: Secondary | ICD-10-CM | POA: Diagnosis not present

## 2019-06-21 DIAGNOSIS — H4311 Vitreous hemorrhage, right eye: Secondary | ICD-10-CM

## 2019-06-21 DIAGNOSIS — Z9889 Other specified postprocedural states: Secondary | ICD-10-CM | POA: Diagnosis not present

## 2019-06-21 DIAGNOSIS — H2513 Age-related nuclear cataract, bilateral: Secondary | ICD-10-CM | POA: Diagnosis not present

## 2019-06-21 DIAGNOSIS — H33311 Horseshoe tear of retina without detachment, right eye: Secondary | ICD-10-CM | POA: Diagnosis not present

## 2019-06-28 ENCOUNTER — Encounter (INDEPENDENT_AMBULATORY_CARE_PROVIDER_SITE_OTHER): Payer: BC Managed Care – PPO | Admitting: Ophthalmology

## 2019-06-28 DIAGNOSIS — H33301 Unspecified retinal break, right eye: Secondary | ICD-10-CM

## 2019-07-08 DIAGNOSIS — Z0000 Encounter for general adult medical examination without abnormal findings: Secondary | ICD-10-CM | POA: Diagnosis not present

## 2019-07-08 DIAGNOSIS — R7301 Impaired fasting glucose: Secondary | ICD-10-CM | POA: Diagnosis not present

## 2019-08-10 DIAGNOSIS — R109 Unspecified abdominal pain: Secondary | ICD-10-CM | POA: Diagnosis not present

## 2019-08-10 DIAGNOSIS — M545 Low back pain: Secondary | ICD-10-CM | POA: Diagnosis not present

## 2019-08-11 ENCOUNTER — Ambulatory Visit
Admission: RE | Admit: 2019-08-11 | Discharge: 2019-08-11 | Disposition: A | Discharge status: Home / Self Care | Payer: BC Managed Care – PPO | Source: Ambulatory Visit | Attending: Family Medicine | Admitting: Family Medicine

## 2019-08-11 ENCOUNTER — Other Ambulatory Visit: Payer: Self-pay | Admitting: Family Medicine

## 2019-08-11 DIAGNOSIS — R109 Unspecified abdominal pain: Secondary | ICD-10-CM

## 2019-08-11 DIAGNOSIS — N1330 Unspecified hydronephrosis: Secondary | ICD-10-CM | POA: Diagnosis not present

## 2019-10-14 DIAGNOSIS — Z20828 Contact with and (suspected) exposure to other viral communicable diseases: Secondary | ICD-10-CM | POA: Diagnosis not present

## 2019-10-22 ENCOUNTER — Other Ambulatory Visit: Payer: Self-pay | Admitting: Family Medicine

## 2019-10-22 DIAGNOSIS — Z1231 Encounter for screening mammogram for malignant neoplasm of breast: Secondary | ICD-10-CM

## 2019-10-29 ENCOUNTER — Other Ambulatory Visit: Payer: Self-pay

## 2019-10-29 ENCOUNTER — Encounter (INDEPENDENT_AMBULATORY_CARE_PROVIDER_SITE_OTHER): Payer: BC Managed Care – PPO | Admitting: Ophthalmology

## 2019-10-29 DIAGNOSIS — H43813 Vitreous degeneration, bilateral: Secondary | ICD-10-CM

## 2019-10-29 DIAGNOSIS — H33301 Unspecified retinal break, right eye: Secondary | ICD-10-CM | POA: Diagnosis not present

## 2019-11-22 DIAGNOSIS — H40033 Anatomical narrow angle, bilateral: Secondary | ICD-10-CM | POA: Diagnosis not present

## 2019-11-22 DIAGNOSIS — H43813 Vitreous degeneration, bilateral: Secondary | ICD-10-CM | POA: Diagnosis not present

## 2019-11-22 DIAGNOSIS — H33311 Horseshoe tear of retina without detachment, right eye: Secondary | ICD-10-CM | POA: Diagnosis not present

## 2019-11-22 DIAGNOSIS — Z9889 Other specified postprocedural states: Secondary | ICD-10-CM | POA: Diagnosis not present

## 2019-11-26 ENCOUNTER — Other Ambulatory Visit: Payer: Self-pay

## 2019-11-26 ENCOUNTER — Ambulatory Visit
Admission: RE | Admit: 2019-11-26 | Discharge: 2019-11-26 | Disposition: A | Discharge status: Home / Self Care | Payer: BC Managed Care – PPO | Source: Ambulatory Visit | Attending: Family Medicine | Admitting: Family Medicine

## 2019-11-26 DIAGNOSIS — Z1231 Encounter for screening mammogram for malignant neoplasm of breast: Secondary | ICD-10-CM | POA: Diagnosis not present

## 2019-12-08 DIAGNOSIS — L84 Corns and callosities: Secondary | ICD-10-CM | POA: Diagnosis not present

## 2019-12-14 DIAGNOSIS — L570 Actinic keratosis: Secondary | ICD-10-CM | POA: Diagnosis not present

## 2019-12-14 DIAGNOSIS — L821 Other seborrheic keratosis: Secondary | ICD-10-CM | POA: Diagnosis not present

## 2019-12-14 DIAGNOSIS — L814 Other melanin hyperpigmentation: Secondary | ICD-10-CM | POA: Diagnosis not present

## 2019-12-14 DIAGNOSIS — Z85828 Personal history of other malignant neoplasm of skin: Secondary | ICD-10-CM | POA: Diagnosis not present

## 2019-12-14 DIAGNOSIS — L578 Other skin changes due to chronic exposure to nonionizing radiation: Secondary | ICD-10-CM | POA: Diagnosis not present

## 2019-12-17 DIAGNOSIS — M5412 Radiculopathy, cervical region: Secondary | ICD-10-CM | POA: Diagnosis not present

## 2019-12-30 DIAGNOSIS — M5412 Radiculopathy, cervical region: Secondary | ICD-10-CM | POA: Diagnosis not present

## 2019-12-30 DIAGNOSIS — R030 Elevated blood-pressure reading, without diagnosis of hypertension: Secondary | ICD-10-CM | POA: Diagnosis not present

## 2019-12-31 ENCOUNTER — Telehealth: Payer: Self-pay | Admitting: Obstetrics & Gynecology

## 2019-12-31 NOTE — Telephone Encounter (Signed)
Spoke with patient. Patient reports hot flashes and night sweats had become more frequent over the past couple of months, currently on Gabapentin 100 mg nightly, this has worked well. Patient states she has not experienced any hot flashes or night sweats this week for the first time, but both nipples are very sensitive and stay erect all of the time. Denies any nipple d/c, pain. No change in medications, no increase in caffeine. Patient did receive the Moderna Covid 19 vaccine on 3/11 and again on 12/30/19. Symptoms started prior to second vaccine.   Negative MMG on 11/26/19.  Last AEX 11/16/18.   Moved AEX to earlier date, 01/11/20 at 10am with Dr. Sabra Heck. Cancelled 03/31/20 AEX. Advised patient to call for earlier OV if new symptoms develop or symptoms worsen. Patient verbalizes understanding and is agreeable.   Routing to provider for final review. Patient is agreeable to disposition. Will close encounter.

## 2019-12-31 NOTE — Telephone Encounter (Signed)
Patient has an issue she would like to discuss with nurse. [phone] [id number]

## 2020-01-06 ENCOUNTER — Other Ambulatory Visit: Payer: Self-pay | Admitting: Neurosurgery

## 2020-01-06 DIAGNOSIS — M5412 Radiculopathy, cervical region: Secondary | ICD-10-CM

## 2020-01-10 ENCOUNTER — Other Ambulatory Visit: Payer: Self-pay

## 2020-01-11 ENCOUNTER — Encounter: Payer: Self-pay | Admitting: Obstetrics & Gynecology

## 2020-01-11 ENCOUNTER — Ambulatory Visit: Payer: BC Managed Care – PPO | Admitting: Obstetrics & Gynecology

## 2020-01-11 VITALS — BP 108/64 | HR 62 | Temp 97.0°F | Resp 14 | Ht 61.0 in | Wt 137.0 lb

## 2020-01-11 DIAGNOSIS — E2839 Other primary ovarian failure: Secondary | ICD-10-CM | POA: Diagnosis not present

## 2020-01-11 DIAGNOSIS — Z01419 Encounter for gynecological examination (general) (routine) without abnormal findings: Secondary | ICD-10-CM | POA: Diagnosis not present

## 2020-01-11 DIAGNOSIS — N951 Menopausal and female climacteric states: Secondary | ICD-10-CM | POA: Diagnosis not present

## 2020-01-11 NOTE — Patient Instructions (Signed)
Plan to have a bone density test done on the same day as your mammogram next year.  This is a DEXA scan.

## 2020-01-11 NOTE — Progress Notes (Signed)
54 y.o. G0P0000 Married White or Caucasian female here for annual exam.  Doing well.  Denies vaginal bleeding.  Had some hot flashes and started gabapentin.  Hot flashes seem to have subsided.  Had significant breast tenderness with nipple soreness.  This has stopped as well.    Had completed the Covid vaccine.    Patient's last menstrual period was 08/16/2015.          Sexually active: Yes.   rarely The current method of family planning is status post hysterectomy.    Exercising: Yes.    doing Marrianne Mood Smoker:  Former  Health Maintenance: Pap: 08-21-15 Neg:Neg HR HPV History of abnormal Pap yes MMG: 11-26-19 3D/Neg/density B/BiRads1 Colonoscopy: 10-07-14 f/u 5 years, guidelines changed to 7 years BMD:   n/a TDaP: 06-25-12 Pneumonia vaccine(s):  no Shingrix:   D/w pt today.  She wants to have this but not right now Hep C testing: 08-21-15 Neg Hep C and HIV Screening Labs: Dr. Stephanie Acre   reports that she quit smoking about 8 years ago. She has a 25.00 pack-year smoking history. She has never used smokeless tobacco. She reports current alcohol use of about 5.0 standard drinks of alcohol per week. She reports that she does not use drugs.  Past Medical History:  Diagnosis Date  . Arthritis   . Cancer ([place]) 2019   Basal cell on forehead  . Dysmenorrhea   . Hand swelling    right  . Hemorrhoid   . Herniated cervical disc   . Migraines   . PONV (postoperative nausea and vomiting)     Past Surgical History:  Procedure Laterality Date  . BARTHOLIN CYST MARSUPIALIZATION Right 08/18/2013   Procedure: BARTHOLIN CYST MARSUPIALIZATION;  Surgeon: Azalia Bilis, MD;  Location: [facility];  Service: Gynecology;  Laterality: Right;  . BASAL CELL CARCINOMA EXCISION  2019  . CERVICAL DISCECTOMY  12/2004   C 5-6  . COLONOSCOPY    . CYSTOSCOPY N/A 08/19/2016   Procedure: CYSTOSCOPY;  Surgeon: Megan Salon, MD;  Location: [facility];  Service: Gynecology;  Laterality: N/A;  . DILATATION &  CURETTAGE/HYSTEROSCOPY WITH MYOSURE N/A 10/09/2015   Procedure: DILATATION & CURETTAGE/HYSTEROSCOPY ;  Surgeon: Megan Salon, MD;  Location: [facility];  Service: Gynecology;  Laterality: N/A;  . HEMORRHOID SURGERY  3/99 & 7/03   X 2  . LAPAROSCOPIC BILATERAL SALPINGECTOMY Bilateral 08/19/2016   Procedure: LAPAROSCOPIC BILATERAL SALPINGECTOMY;  Surgeon: Megan Salon, MD;  Location: [facility];  Service: Gynecology;  Laterality: Bilateral;  . LAPAROSCOPIC HYSTERECTOMY N/A 08/19/2016   Procedure: HYSTERECTOMY TOTAL LAPAROSCOPIC;  Surgeon: Megan Salon, MD;  Location: [facility];  Service: Gynecology;  Laterality: N/A;  . MOHS SURGERY  07/24/2018  . PILONIDAL CYST EXCISION  6/97  . RETINAL TEAR REPAIR CRYOTHERAPY Right 10/2018  . ROTATOR CUFF REPAIR Left    11/12/16  . TYMPANOSTOMY TUBE PLACEMENT      Current Outpatient Medications  Medication Sig Dispense Refill  . ALPRAZolam (XANAX) 0.25 MG tablet Take 1 tablet (0.25 mg total) by mouth as needed. 15 tablet 0  . celecoxib (CELEBREX) 200 MG capsule Take 1 capsule (200 mg total) by mouth daily as needed for moderate pain. Reported on 10/27/2015 30 capsule 0  . Lancets (ONETOUCH DELICA PLUS [id number]) MISC as needed.     . Multiple Vitamins-Calcium (ONE-A-DAY WOMENS PO) Take 1 tablet by mouth daily.     Glory Rosebush VERIO test strip as needed.      No  current facility-administered medications for this visit.    Family History  Problem Relation Age of Onset  . Diabetes Mother   . Aneurysm Father        abdominal aneurysm  . Other Sister 91       ?PE  . Aneurysm Paternal Uncle        abdominal aneurysm  . Diabetes Maternal Grandmother   . Diabetes Maternal Grandfather   . Stroke Maternal Grandfather   . Hypertension Maternal Grandfather   . Breast cancer Neg Hx     Review of Systems  All other systems reviewed and are negative.   Exam:   BP 108/64   Pulse 62   Temp (!) 97 F (36.1 C) (Temporal)   Resp 14   Ht 5\' 1"  (1.549 m)   Wt 137  lb (62.1 kg)   LMP 08/16/2015   BMI 25.89 kg/m     Height: 5\' 1"  (154.9 cm)  Ht Readings from Last 3 Encounters:  01/11/20 5\' 1"  (1.549 m)  11/16/18 5' 1.5" (1.562 m)  09/30/17 5' 1.75" (1.568 m)   General appearance: alert, cooperative and appears stated age Head: Normocephalic, without obvious abnormality, atraumatic Neck: no adenopathy, supple, symmetrical, trachea midline and thyroid normal to inspection and palpation Lungs: clear to auscultation bilaterally Breasts: normal appearance, no masses or tenderness Heart: regular rate and rhythm Abdomen: soft, non-tender; bowel sounds normal; no masses,  no organomegaly Extremities: extremities normal, atraumatic, no cyanosis or edema Skin: Skin color, texture, turgor normal. No rashes or lesions Lymph nodes: Cervical, supraclavicular, and axillary nodes normal. No abnormal inguinal nodes palpated Neurologic: Grossly normal   Pelvic: External genitalia:  no lesions              Urethra:  normal appearing urethra with no masses, tenderness or lesions              Bartholins and Skenes: normal                 Vagina: normal appearing vagina with normal color and discharge, no lesions              Cervix: absent              Pap taken: No. Bimanual Exam:  Uterus:  uterus absent              Adnexa: normal adnexa and no mass, fullness, tenderness               Rectovaginal: Confirms               Anus:  normal sphincter tone, no lesions  Chaperone, Terence Lux, CMA, was present for exam.  A:  Well Woman with normal exam PMP, no HRT Recent hot flashes H/o TLH/bilateral salpingectomy due to PMP bleeding 11/17 H/o right bartholin's gland cyst with I&D 12/13 and then 11/14  P:   Mammogram guidelines reviewed pap smear not indicated [place] obtained today BMD order placed today Repeat colonoscopy due in two years Vaccines reviewed.  Did complete the covid vaccination. Return annually or prn

## 2020-01-12 LAB — FOLLICLE STIMULATING HORMONE: FSH: 102 m[IU]/mL

## 2020-02-04 ENCOUNTER — Other Ambulatory Visit: Payer: Self-pay

## 2020-02-04 ENCOUNTER — Ambulatory Visit
Admission: RE | Admit: 2020-02-04 | Discharge: 2020-02-04 | Disposition: A | Discharge status: Home / Self Care | Payer: BC Managed Care – PPO | Source: Ambulatory Visit | Attending: Neurosurgery | Admitting: Neurosurgery

## 2020-02-04 DIAGNOSIS — M4802 Spinal stenosis, cervical region: Secondary | ICD-10-CM | POA: Diagnosis not present

## 2020-02-04 DIAGNOSIS — M5412 Radiculopathy, cervical region: Secondary | ICD-10-CM

## 2020-02-09 DIAGNOSIS — M5412 Radiculopathy, cervical region: Secondary | ICD-10-CM | POA: Diagnosis not present

## 2020-02-28 ENCOUNTER — Other Ambulatory Visit: Payer: Self-pay | Admitting: Physician Assistant

## 2020-02-28 DIAGNOSIS — R0989 Other specified symptoms and signs involving the circulatory and respiratory systems: Secondary | ICD-10-CM | POA: Diagnosis not present

## 2020-02-28 DIAGNOSIS — R1313 Dysphagia, pharyngeal phase: Secondary | ICD-10-CM

## 2020-02-28 DIAGNOSIS — R1310 Dysphagia, unspecified: Secondary | ICD-10-CM | POA: Diagnosis not present

## 2020-02-29 ENCOUNTER — Ambulatory Visit
Admission: RE | Admit: 2020-02-29 | Discharge: 2020-02-29 | Disposition: A | Discharge status: Home / Self Care | Payer: BC Managed Care – PPO | Source: Ambulatory Visit | Attending: Physician Assistant | Admitting: Physician Assistant

## 2020-02-29 DIAGNOSIS — K224 Dyskinesia of esophagus: Secondary | ICD-10-CM | POA: Diagnosis not present

## 2020-02-29 DIAGNOSIS — R1313 Dysphagia, pharyngeal phase: Secondary | ICD-10-CM

## 2020-02-29 DIAGNOSIS — K449 Diaphragmatic hernia without obstruction or gangrene: Secondary | ICD-10-CM | POA: Diagnosis not present

## 2020-03-01 DIAGNOSIS — Z03818 Encounter for observation for suspected exposure to other biological agents ruled out: Secondary | ICD-10-CM | POA: Diagnosis not present

## 2020-03-06 DIAGNOSIS — R1310 Dysphagia, unspecified: Secondary | ICD-10-CM | POA: Diagnosis not present

## 2020-03-09 ENCOUNTER — Encounter (INDEPENDENT_AMBULATORY_CARE_PROVIDER_SITE_OTHER): Payer: Self-pay | Admitting: Otolaryngology

## 2020-03-09 ENCOUNTER — Other Ambulatory Visit: Payer: Self-pay

## 2020-03-09 ENCOUNTER — Ambulatory Visit (INDEPENDENT_AMBULATORY_CARE_PROVIDER_SITE_OTHER): Payer: BC Managed Care – PPO | Admitting: Otolaryngology

## 2020-03-09 VITALS — Temp 97.5°F

## 2020-03-09 DIAGNOSIS — R1313 Dysphagia, pharyngeal phase: Secondary | ICD-10-CM

## 2020-03-09 DIAGNOSIS — K219 Gastro-esophageal reflux disease without esophagitis: Secondary | ICD-10-CM

## 2020-03-09 NOTE — Progress Notes (Signed)
HPI: [name] is a 54 y.o. female who presents for evaluation of chronic throat discomfort.  This initially began on May 30 while patient was eating Cheerios 1 got stuck in her throat on the right side and she had difficulty getting it out.  She eventually went to the ED and had a evaluation with a negative evaluation.  She underwent a barium swallow that demonstrated everything to be normal.  She also had upper GI endoscopy with gastroenterology on 03/06/2020.  Apparently on endoscopy everything appeared normal except for some mild inflammation of the epiglottis with a white plaque at the tip of it.  She is subsequently referred here. She is able to swallow liquids.  However certain foods such as Jell-O will cause intense pain and difficulty swallowing more on the right side.  She points to pain in her neck at the bout the level of the larynx on the right side. She does have history of GE reflux disease  Past Medical History:  Diagnosis Date  . Arthritis   . Cancer ([place]) 2019   Basal cell on forehead  . Dysmenorrhea   . Hand swelling    right  . Hemorrhoid   . Herniated cervical disc   . Migraines   . PONV (postoperative nausea and vomiting)    Past Surgical History:  Procedure Laterality Date  . BARTHOLIN CYST MARSUPIALIZATION Right 08/18/2013   Procedure: BARTHOLIN CYST MARSUPIALIZATION;  Surgeon: Azalia Bilis, MD;  Location: [facility];  Service: Gynecology;  Laterality: Right;  . BASAL CELL CARCINOMA EXCISION  2019  . CERVICAL DISCECTOMY  12/2004   C 5-6  . COLONOSCOPY    . CYSTOSCOPY N/A 08/19/2016   Procedure: CYSTOSCOPY;  Surgeon: Megan Salon, MD;  Location: [facility];  Service: Gynecology;  Laterality: N/A;  . DILATATION & CURETTAGE/HYSTEROSCOPY WITH MYOSURE N/A 10/09/2015   Procedure: DILATATION & CURETTAGE/HYSTEROSCOPY ;  Surgeon: Megan Salon, MD;  Location: [facility];  Service: Gynecology;  Laterality: N/A;  . HEMORRHOID SURGERY  3/99 & 7/03   X 2  . LAPAROSCOPIC  BILATERAL SALPINGECTOMY Bilateral 08/19/2016   Procedure: LAPAROSCOPIC BILATERAL SALPINGECTOMY;  Surgeon: Megan Salon, MD;  Location: [facility];  Service: Gynecology;  Laterality: Bilateral;  . LAPAROSCOPIC HYSTERECTOMY N/A 08/19/2016   Procedure: HYSTERECTOMY TOTAL LAPAROSCOPIC;  Surgeon: Megan Salon, MD;  Location: [facility];  Service: Gynecology;  Laterality: N/A;  . MOHS SURGERY  07/24/2018  . PILONIDAL CYST EXCISION  6/97  . RETINAL TEAR REPAIR CRYOTHERAPY Right 10/2018  . ROTATOR CUFF REPAIR Left    11/12/16  . TYMPANOSTOMY TUBE PLACEMENT     Social History   Socioeconomic History  . Marital status: Married    Spouse name: Not on file  . Number of children: 0  . Years of education: Not on file  . Highest education level: Not on file  Occupational History  . Not on file  Tobacco Use  . Smoking status: Former Smoker    Packs/day: 1.00    Years: 25.00    Pack years: 25.00    Quit date: 11/03/2011    Years since quitting: 8.3  . Smokeless tobacco: Never Used  Vaping Use  . Vaping Use: Never used  Substance and Sexual Activity  . Alcohol use: Yes    Alcohol/week: 5.0 standard drinks    Types: 5 Glasses of wine per week  . Drug use: No  . Sexual activity: Yes    Partners: Female    Birth control/protection:  Surgical  Other Topics Concern  . Not on file  Social History Narrative  . Not on file   Social Determinants of Health   Financial Resource Strain:   . Difficulty of Paying Living Expenses:   Food Insecurity:   . Worried About Charity fundraiser in the Last Year:   . Arboriculturist in the Last Year:   Transportation Needs:   . Film/video editor (Medical):   Marland Kitchen Lack of Transportation (Non-Medical):   Physical Activity:   . Days of Exercise per Week:   . Minutes of Exercise per Session:   Stress:   . Feeling of Stress :   Social Connections:   . Frequency of Communication with Friends and Family:   . Frequency of Social Gatherings with Friends and  Family:   . Attends Religious Services:   . Active Member of Clubs or Organizations:   . Attends Archivist Meetings:   Marland Kitchen Marital Status:    Family History  Problem Relation Age of Onset  . Diabetes Mother   . Aneurysm Father        abdominal aneurysm  . Other Sister 34       ?PE  . Aneurysm Paternal Uncle        abdominal aneurysm  . Diabetes Maternal Grandmother   . Diabetes Maternal Grandfather   . Stroke Maternal Grandfather   . Hypertension Maternal Grandfather   . Breast cancer Neg Hx    Allergies  Allergen Reactions  . Prilosec [Omeprazole] Other (See Comments)    headaches  . Vicodin [Hydrocodone-Acetaminophen] Nausea And Vomiting   Prior to Admission medications   Medication Sig Start Date End Date Taking? Authorizing Provider  ALPRAZolam (XANAX) 0.25 MG tablet Take 1 tablet (0.25 mg total) by mouth as needed. 11/16/18  Yes Megan Salon, MD  celecoxib (CELEBREX) 200 MG capsule Take 1 capsule (200 mg total) by mouth daily as needed for moderate pain. Reported on 10/27/2015 09/19/16  Yes Megan Salon, MD  Lancets [facility] DELICA PLUS [id number]) [place] as needed.  06/06/18  Yes [provider]  Multiple Vitamins-Calcium (ONE-A-DAY WOMENS PO) Take 1 tablet by mouth daily.    Yes [provider]  [facility] VERIO test strip as needed.  05/29/18  Yes [provider]     Positive ROS: Otherwise negative  All other systems have been reviewed and were otherwise negative with the exception of those mentioned in the HPI and as above.  Physical Exam: Constitutional: Alert, well-appearing, no acute distress.  Patient is having no airway problems. Ears: External ears without lesions or tenderness. Ear canals are clear bilaterally with intact, clear TMs.  Nasal: External nose without lesions. Septum midline with mild rhinitis. Clear nasal passages otherwise. Oral: Lips and gums without lesions. Tongue and palate mucosa without lesions. Posterior  oropharynx clear.  Patient is status post tonsillectomy.  Of note she has a strong gag reflex making indirect laryngoscopy very difficult. Fiberoptic laryngoscopy was performed to the right nostril and on fiberoptic laryngoscopy the nasopharynx was clear base of tongue vallecula and epiglottis were normal on close evaluation of the tip of the epiglottis this is normal in appearance with no lesions.  Piriform sinuses were clear hypopharyngeal laryngeal mucosa was normal.  I was able to pass the fiberoptic laryngoscope through the upper esophageal sphincter without difficulty the upper cervical esophagus appeared clear.  No mucosal abnormalities noted. Manual palpation of the tonsil region base of tongue and hypopharynx  on the right side was soft and normal to palpation. Neck: No palpable adenopathy or masses.  No palpable swelling or adenopathy noted in the right neck. Respiratory: Breathing comfortably  Skin: No facial/neck lesions or rash noted.  Laryngoscopy  Date/Time: 03/09/2020 9:40 PM Performed by: Rozetta Nunnery, MD Authorized by: Rozetta Nunnery, MD   Consent:    Consent obtained:  Verbal   Consent given by:  Patient Procedure details:    Indications: hoarseness, dysphagia, or aspiration     Medication:  Afrin   Scope location: right nare   Sinus:    Right nasopharynx: normal   Mouth:    Oropharynx: normal     Vallecula: normal     Base of tongue: normal     Epiglottis: normal   Throat:    Right hypopharynx: normal     Left hypopharynx: normal     Pyriform sinus: normal     True vocal cords: normal   Comments:     On fiberoptic laryngoscopy patient had a clear hypopharynx and larynx with no mucosal abnormalities noted.  The tip of the epiglottis was clear although there was a small white area but this was mucosal covered and not ulcerated.  Could not identify any mucosal abnormalities.  I was able to pass the fiberoptic laryngoscope through the upper esophageal  sphincter without difficulty with clear upper cervical esophagus.    Assessment: Dysphagia and sore throat questionable etiology as she has normal upper airway examination on fiberoptic laryngoscopy as well as barium swallow and upper GI endoscopy. I suspect she does have some degree of reflux.  I am not sure why she is not able to swallow well.  Plan: Placed her on amoxicillin suspension 500 mg 3 times daily for a week along with omeprazole 40 mg daily before dinner and a Sterapred 10 mg 6-day Dosepak.  Radene Journey, MD

## 2020-03-22 ENCOUNTER — Encounter (INDEPENDENT_AMBULATORY_CARE_PROVIDER_SITE_OTHER): Payer: Self-pay | Admitting: Otolaryngology

## 2020-03-22 ENCOUNTER — Other Ambulatory Visit: Payer: Self-pay

## 2020-03-22 ENCOUNTER — Encounter (INDEPENDENT_AMBULATORY_CARE_PROVIDER_SITE_OTHER): Payer: Self-pay

## 2020-03-22 ENCOUNTER — Ambulatory Visit (INDEPENDENT_AMBULATORY_CARE_PROVIDER_SITE_OTHER): Payer: BC Managed Care – PPO | Admitting: Otolaryngology

## 2020-03-22 VITALS — Temp 98.1°F

## 2020-03-22 DIAGNOSIS — R1313 Dysphagia, pharyngeal phase: Secondary | ICD-10-CM

## 2020-03-22 DIAGNOSIS — R1310 Dysphagia, unspecified: Secondary | ICD-10-CM | POA: Diagnosis not present

## 2020-03-22 NOTE — Progress Notes (Unsigned)
I called into [facility]. Diflucan 150 mg. Use 1 every other day for 6 days. No refills.

## 2020-03-22 NOTE — Progress Notes (Signed)
HPI: [name] is a 54 y.o. female who returns today for evaluation of difficulty swallowing with intermittent pain that occurs in the right lower neck.  She does well with liquids but when she tries to eat solid food she develops intermittent pain and discomfort in the right lower neck and points to the region on the right side at the level of the cricoid cartilage.  She has had previous upper GI endoscopy as well as barium swallow which were negative.  She has had previous anterior cervical fusion performed to the left side of her neck performed 15 years ago. The symptoms began about a month ago after she ate a Cheeto.  Past Medical History:  Diagnosis Date  . Arthritis   . Cancer ([place]) 2019   Basal cell on forehead  . Dysmenorrhea   . Hand swelling    right  . Hemorrhoid   . Herniated cervical disc   . Migraines   . PONV (postoperative nausea and vomiting)    Past Surgical History:  Procedure Laterality Date  . BARTHOLIN CYST MARSUPIALIZATION Right 08/18/2013   Procedure: BARTHOLIN CYST MARSUPIALIZATION;  Surgeon: Azalia Bilis, MD;  Location: [facility];  Service: Gynecology;  Laterality: Right;  . BASAL CELL CARCINOMA EXCISION  2019  . CERVICAL DISCECTOMY  12/2004   C 5-6  . COLONOSCOPY    . CYSTOSCOPY N/A 08/19/2016   Procedure: CYSTOSCOPY;  Surgeon: Megan Salon, MD;  Location: [facility];  Service: Gynecology;  Laterality: N/A;  . DILATATION & CURETTAGE/HYSTEROSCOPY WITH MYOSURE N/A 10/09/2015   Procedure: DILATATION & CURETTAGE/HYSTEROSCOPY ;  Surgeon: Megan Salon, MD;  Location: [facility];  Service: Gynecology;  Laterality: N/A;  . HEMORRHOID SURGERY  3/99 & 7/03   X 2  . LAPAROSCOPIC BILATERAL SALPINGECTOMY Bilateral 08/19/2016   Procedure: LAPAROSCOPIC BILATERAL SALPINGECTOMY;  Surgeon: Megan Salon, MD;  Location: [facility];  Service: Gynecology;  Laterality: Bilateral;  . LAPAROSCOPIC HYSTERECTOMY N/A 08/19/2016   Procedure: HYSTERECTOMY TOTAL LAPAROSCOPIC;  Surgeon:  Megan Salon, MD;  Location: [facility];  Service: Gynecology;  Laterality: N/A;  . MOHS SURGERY  07/24/2018  . PILONIDAL CYST EXCISION  6/97  . RETINAL TEAR REPAIR CRYOTHERAPY Right 10/2018  . ROTATOR CUFF REPAIR Left    11/12/16  . TYMPANOSTOMY TUBE PLACEMENT     Social History   Socioeconomic History  . Marital status: Married    Spouse name: Not on file  . Number of children: 0  . Years of education: Not on file  . Highest education level: Not on file  Occupational History  . Not on file  Tobacco Use  . Smoking status: Former Smoker    Packs/day: 1.00    Years: 25.00    Pack years: 25.00    Quit date: 11/03/2011    Years since quitting: 8.3  . Smokeless tobacco: Never Used  Vaping Use  . Vaping Use: Never used  Substance and Sexual Activity  . Alcohol use: Yes    Alcohol/week: 5.0 standard drinks    Types: 5 Glasses of wine per week  . Drug use: No  . Sexual activity: Yes    Partners: Female    Birth control/protection: Surgical  Other Topics Concern  . Not on file  Social History Narrative  . Not on file   Social Determinants of Health   Financial Resource Strain:   . Difficulty of Paying Living Expenses:   Food Insecurity:   . Worried About Charity fundraiser  in the Last Year:   . [place] in the Last Year:   Transportation Needs:   . Film/video editor (Medical):   Marland Kitchen Lack of Transportation (Non-Medical):   Physical Activity:   . Days of Exercise per Week:   . Minutes of Exercise per Session:   Stress:   . Feeling of Stress :   Social Connections:   . Frequency of Communication with Friends and Family:   . Frequency of Social Gatherings with Friends and Family:   . Attends Religious Services:   . Active Member of Clubs or Organizations:   . Attends Archivist Meetings:   Marland Kitchen Marital Status:    Family History  Problem Relation Age of Onset  . Diabetes Mother   . Aneurysm Father        abdominal aneurysm  . Other Sister 53        ?PE  . Aneurysm Paternal Uncle        abdominal aneurysm  . Diabetes Maternal Grandmother   . Diabetes Maternal Grandfather   . Stroke Maternal Grandfather   . Hypertension Maternal Grandfather   . Breast cancer Neg Hx    Allergies  Allergen Reactions  . Prilosec [Omeprazole] Other (See Comments)    headaches  . Vicodin [Hydrocodone-Acetaminophen] Nausea And Vomiting   Prior to Admission medications   Medication Sig Start Date End Date Taking? Authorizing Provider  ALPRAZolam (XANAX) 0.25 MG tablet Take 1 tablet (0.25 mg total) by mouth as needed. 11/16/18  Yes Megan Salon, MD  celecoxib (CELEBREX) 200 MG capsule Take 1 capsule (200 mg total) by mouth daily as needed for moderate pain. Reported on 10/27/2015 09/19/16  Yes Megan Salon, MD  Lancets [facility] DELICA PLUS [id number]) [place] as needed.  06/06/18  Yes [provider]  Multiple Vitamins-Calcium (ONE-A-DAY WOMENS PO) Take 1 tablet by mouth daily.    Yes [provider]  [facility] VERIO test strip as needed.  05/29/18  Yes [provider]     Positive ROS: Otherwise negative  All other systems have been reviewed and were otherwise negative with the exception of those mentioned in the HPI and as above.  Physical Exam: Constitutional: Alert, well-appearing, no acute distress Ears: External ears without lesions or tenderness. Ear canals are clear bilaterally with intact, clear TMs.  Nasal: External nose without lesions. Septum midline. Clear nasal passages Oral: Lips and gums without lesions. Tongue and palate mucosa without lesions. Posterior oropharynx clear.  Patient is status post tonsillectomy. Fiberoptic laryngoscopy was performed to the left nostril.  Nasopharynx was clear.  Base of tongue vallecula and epiglottis were normal.  Both piriform sinuses were clear.  Vocal cords were clear with normal mobility.  The fiberoptic laryngoscope was passed through the upper esophageal sphincter without  difficulty with clear upper esophagus. Neck: No palpable adenopathy or masses Respiratory: Breathing comfortably  Skin: No facial/neck lesions or rash noted.  Laryngoscopy  Date/Time: 03/22/2020 1:01 PM Performed by: Rozetta Nunnery, MD Authorized by: Rozetta Nunnery, MD   Consent:    Consent obtained:  Verbal   Consent given by:  Patient Procedure details:    Indications: direct visualization of the upper aerodigestive tract     Medication:  Afrin   Instrument: flexible fiberoptic laryngoscope     Scope location: left nare   Sinus:    Left nasopharynx: normal   Mouth:    Vallecula: normal     Base of tongue: normal  Epiglottis: normal   Throat:    Right hypopharynx: normal     Pyriform sinus: normal     True vocal cords: normal   Comments:     On fiberoptic laryngoscopy the region of discomfort most likely is related to around the upper esophageal sphincter according to the patient but everything appeared clear on in this region on fiberoptic laryngoscopy.    Assessment: Dysphagia and odynophagia on the right side.  Questionable etiology  Plan: Recommended continue use of the omeprazole before dinner for total of 4 weeks.  If she develops any sore throat recommended use of Cepacol spray or lozenges. She had previously taken amoxicillin and needed Diflucan as this frequently causes yeast infections and prescribed Diflucan today. If symptoms persist beyond 2 more weeks consider further evaluation with CT scan of neck. Question whether this could be related to cervical neck problems.  She had previous MRI scan of her neck a couple months ago but well before her symptoms began following swallowing the cheeto   Radene Journey, MD

## 2020-03-31 ENCOUNTER — Ambulatory Visit: Payer: BLUE CROSS/BLUE SHIELD | Admitting: Obstetrics & Gynecology

## 2020-05-22 DIAGNOSIS — D485 Neoplasm of uncertain behavior of skin: Secondary | ICD-10-CM | POA: Diagnosis not present

## 2020-05-22 DIAGNOSIS — L281 Prurigo nodularis: Secondary | ICD-10-CM | POA: Diagnosis not present

## 2020-05-22 DIAGNOSIS — M79671 Pain in right foot: Secondary | ICD-10-CM | POA: Diagnosis not present

## 2020-06-27 DIAGNOSIS — H6992 Unspecified Eustachian tube disorder, left ear: Secondary | ICD-10-CM | POA: Diagnosis not present

## 2020-08-08 DIAGNOSIS — Z0000 Encounter for general adult medical examination without abnormal findings: Secondary | ICD-10-CM | POA: Diagnosis not present

## 2020-08-08 DIAGNOSIS — Z13220 Encounter for screening for lipoid disorders: Secondary | ICD-10-CM | POA: Diagnosis not present

## 2020-08-08 DIAGNOSIS — E559 Vitamin D deficiency, unspecified: Secondary | ICD-10-CM | POA: Diagnosis not present

## 2020-08-08 DIAGNOSIS — R7303 Prediabetes: Secondary | ICD-10-CM | POA: Diagnosis not present

## 2020-08-08 DIAGNOSIS — Z79899 Other long term (current) drug therapy: Secondary | ICD-10-CM | POA: Diagnosis not present

## 2020-10-13 ENCOUNTER — Other Ambulatory Visit: Payer: Self-pay | Admitting: Obstetrics & Gynecology

## 2020-10-13 DIAGNOSIS — E2839 Other primary ovarian failure: Secondary | ICD-10-CM

## 2020-10-13 DIAGNOSIS — Z1231 Encounter for screening mammogram for malignant neoplasm of breast: Secondary | ICD-10-CM

## 2020-10-17 DIAGNOSIS — M5410 Radiculopathy, site unspecified: Secondary | ICD-10-CM | POA: Diagnosis not present

## 2020-10-26 DIAGNOSIS — M545 Low back pain, unspecified: Secondary | ICD-10-CM | POA: Diagnosis not present

## 2020-10-26 DIAGNOSIS — M5416 Radiculopathy, lumbar region: Secondary | ICD-10-CM | POA: Diagnosis not present

## 2020-11-17 ENCOUNTER — Other Ambulatory Visit: Payer: Self-pay | Admitting: Student

## 2020-11-17 DIAGNOSIS — M5416 Radiculopathy, lumbar region: Secondary | ICD-10-CM

## 2020-11-22 DIAGNOSIS — H33311 Horseshoe tear of retina without detachment, right eye: Secondary | ICD-10-CM | POA: Diagnosis not present

## 2020-11-22 DIAGNOSIS — H43813 Vitreous degeneration, bilateral: Secondary | ICD-10-CM | POA: Diagnosis not present

## 2020-11-22 DIAGNOSIS — Z9889 Other specified postprocedural states: Secondary | ICD-10-CM | POA: Diagnosis not present

## 2020-11-22 DIAGNOSIS — C44310 Basal cell carcinoma of skin of unspecified parts of face: Secondary | ICD-10-CM | POA: Diagnosis not present

## 2020-11-28 ENCOUNTER — Ambulatory Visit: Payer: BC Managed Care – PPO

## 2020-12-08 ENCOUNTER — Inpatient Hospital Stay: Admission: RE | Admit: 2020-12-08 | Payer: BC Managed Care – PPO | Source: Ambulatory Visit

## 2020-12-19 ENCOUNTER — Other Ambulatory Visit: Payer: Self-pay

## 2020-12-19 ENCOUNTER — Ambulatory Visit
Admission: RE | Admit: 2020-12-19 | Discharge: 2020-12-19 | Disposition: A | Discharge status: Home / Self Care | Payer: BC Managed Care – PPO | Source: Ambulatory Visit | Attending: Student | Admitting: Student

## 2020-12-19 DIAGNOSIS — M5416 Radiculopathy, lumbar region: Secondary | ICD-10-CM

## 2020-12-19 DIAGNOSIS — M545 Low back pain, unspecified: Secondary | ICD-10-CM | POA: Diagnosis not present

## 2020-12-19 DIAGNOSIS — M48061 Spinal stenosis, lumbar region without neurogenic claudication: Secondary | ICD-10-CM | POA: Diagnosis not present

## 2020-12-21 DIAGNOSIS — M4310 Spondylolisthesis, site unspecified: Secondary | ICD-10-CM | POA: Diagnosis not present

## 2021-01-04 DIAGNOSIS — M47816 Spondylosis without myelopathy or radiculopathy, lumbar region: Secondary | ICD-10-CM | POA: Diagnosis not present

## 2021-01-04 DIAGNOSIS — M4310 Spondylolisthesis, site unspecified: Secondary | ICD-10-CM | POA: Diagnosis not present

## 2021-01-09 DIAGNOSIS — M47816 Spondylosis without myelopathy or radiculopathy, lumbar region: Secondary | ICD-10-CM | POA: Diagnosis not present

## 2021-01-09 DIAGNOSIS — M4310 Spondylolisthesis, site unspecified: Secondary | ICD-10-CM | POA: Diagnosis not present

## 2021-01-16 ENCOUNTER — Ambulatory Visit (INDEPENDENT_AMBULATORY_CARE_PROVIDER_SITE_OTHER): Payer: BC Managed Care – PPO | Admitting: Obstetrics & Gynecology

## 2021-01-16 ENCOUNTER — Encounter (HOSPITAL_BASED_OUTPATIENT_CLINIC_OR_DEPARTMENT_OTHER): Payer: Self-pay | Admitting: Obstetrics & Gynecology

## 2021-01-16 ENCOUNTER — Other Ambulatory Visit: Payer: Self-pay

## 2021-01-16 VITALS — BP 120/71 | HR 56 | Ht 61.5 in | Wt 128.8 lb

## 2021-01-16 DIAGNOSIS — Z780 Asymptomatic menopausal state: Secondary | ICD-10-CM

## 2021-01-16 DIAGNOSIS — Z01419 Encounter for gynecological examination (general) (routine) without abnormal findings: Secondary | ICD-10-CM | POA: Diagnosis not present

## 2021-01-16 DIAGNOSIS — Z90710 Acquired absence of both cervix and uterus: Secondary | ICD-10-CM | POA: Diagnosis not present

## 2021-01-16 DIAGNOSIS — N750 Cyst of Bartholin's gland: Secondary | ICD-10-CM

## 2021-01-16 MED ORDER — GABAPENTIN 100 MG PO CAPS: ORAL_CAPSULE | ORAL | 3 refills | Status: DC

## 2021-01-16 NOTE — Progress Notes (Signed)
55 y.o. G0P0000 Married White or Caucasian female here for annual exam.  Has been busy with work and [employer] at [employer].    Just did a transformation challenge for two months.  She's down about 10 pounds from last year.  Denies vaginal bleeding.    Has upcoming beach trip which is first trip together in almost 8 years for her and wife.  Partner's working from home over last two years has not been a great thing for them.    Hot flashes are bothering her more right now.  Has used gabapentin in the past but having some more during the day.  Desires suggestions.  Patient's last menstrual period was 08/16/2015.          Sexually active: Yes.    The current method of family planning is status post hysterectomy.    Exercising: Yes.    oragne theory Smoker:  former  Health Maintenance: Pap:  07/2015 neg with neg HR HPV History of abnormal Pap:  Remote hx MMG:  11/2019 Colonoscopy:  10/07/14 follow up 5 years initially, guidelines changed to 7 years.   BMD:   Discussed screening TDaP:  06/25/2012 Pneumonia vaccine(s):  Not indicated Shingrix:   discussed Hep C testing: 08/21/2015 with neg HIV Screening Labs: Dr. Stephanie Acre   reports that she quit smoking about 9 years ago. She has a 25.00 pack-year smoking history. She has never used smokeless tobacco. She reports current alcohol use of about 5.0 standard drinks of alcohol per week. She reports that she does not use drugs.  Past Medical History:  Diagnosis Date  . Arthritis   . Cancer ([place]) 2019   Basal cell on forehead  . Dysmenorrhea   . Hand swelling    right  . Hemorrhoid   . Herniated cervical disc   . Migraines   . PONV (postoperative nausea and vomiting)     Past Surgical History:  Procedure Laterality Date  . BARTHOLIN CYST MARSUPIALIZATION Right 08/18/2013   Procedure: BARTHOLIN CYST MARSUPIALIZATION;  Surgeon: Azalia Bilis, MD;  Location: [facility];  Service: Gynecology;  Laterality: Right;  .  BASAL CELL CARCINOMA EXCISION  2019  . CERVICAL DISCECTOMY  12/2004   C 5-6  . COLONOSCOPY    . CYSTOSCOPY N/A 08/19/2016   Procedure: CYSTOSCOPY;  Surgeon: Megan Salon, MD;  Location: [facility];  Service: Gynecology;  Laterality: N/A;  . DILATATION & CURETTAGE/HYSTEROSCOPY WITH MYOSURE N/A 10/09/2015   Procedure: DILATATION & CURETTAGE/HYSTEROSCOPY ;  Surgeon: Megan Salon, MD;  Location: [facility];  Service: Gynecology;  Laterality: N/A;  . HEMORRHOID SURGERY  3/99 & 7/03   X 2  . LAPAROSCOPIC BILATERAL SALPINGECTOMY Bilateral 08/19/2016   Procedure: LAPAROSCOPIC BILATERAL SALPINGECTOMY;  Surgeon: Megan Salon, MD;  Location: [facility];  Service: Gynecology;  Laterality: Bilateral;  . LAPAROSCOPIC HYSTERECTOMY N/A 08/19/2016   Procedure: HYSTERECTOMY TOTAL LAPAROSCOPIC;  Surgeon: Megan Salon, MD;  Location: [facility];  Service: Gynecology;  Laterality: N/A;  . MOHS SURGERY  07/24/2018  . PILONIDAL CYST EXCISION  6/97  . RETINAL TEAR REPAIR CRYOTHERAPY Right 10/2018  . ROTATOR CUFF REPAIR Left    11/12/16  . TYMPANOSTOMY TUBE PLACEMENT      Current Outpatient Medications  Medication Sig Dispense Refill  . ALPRAZolam (XANAX) 0.25 MG tablet Take 1 tablet (0.25 mg total) by mouth as needed. 15 tablet 0  . celecoxib (CELEBREX) 200 MG capsule Take 1 capsule (200 mg total) by mouth  daily as needed for moderate pain. Reported on 10/27/2015 30 capsule 0  . Multiple Vitamins-Calcium (ONE-A-DAY WOMENS PO) Take 1 tablet by mouth daily.     Marland Kitchen gabapentin (NEURONTIN) 100 MG capsule Take 1 capsule TID as needed for hot flashes.  Can increase night time dosage to 300mg . 90 capsule 3   No current facility-administered medications for this visit.    Family History  Problem Relation Age of Onset  . Diabetes Mother   . Aneurysm Father        abdominal aneurysm  . Other Sister 101       ?PE  . Aneurysm Paternal Uncle        abdominal aneurysm  . Diabetes Maternal Grandmother   . Diabetes Maternal  Grandfather   . Stroke Maternal Grandfather   . Hypertension Maternal Grandfather   . Breast cancer Neg Hx     Review of Systems  All other systems reviewed and are negative.   Exam:   BP 120/71   Pulse (!) 56   Ht 5' 1.5" (1.562 m)   Wt 128 lb 12.8 oz (58.4 kg)   LMP 08/16/2015   BMI 23.94 kg/m   Height: 5' 1.5" (156.2 cm)  General appearance: alert, cooperative and appears stated age Head: Normocephalic, without obvious abnormality, atraumatic Neck: no adenopathy, supple, symmetrical, trachea midline and thyroid normal to inspection and palpation Lungs: clear to auscultation bilaterally Breasts: normal appearance, no masses or tenderness Heart: regular rate and rhythm Abdomen: soft, non-tender; bowel sounds normal; no masses,  no organomegaly Extremities: extremities normal, atraumatic, no cyanosis or edema Skin: Skin color, texture, turgor normal. No rashes or lesions Lymph nodes: Cervical, supraclavicular, and axillary nodes normal. No abnormal inguinal nodes palpated Neurologic: Grossly normal   Pelvic: External genitalia:  no lesions              Urethra:  normal appearing urethra with no masses, tenderness or lesions              Bartholins and Skenes: normal                 Vagina: normal appearing vagina with normal color and no discharge, no lesions              Cervix: absent              Pap taken: No. Bimanual Exam:  Uterus:  uterus absent              Adnexa: no mass, fullness, tenderness               Rectovaginal: Confirms               Anus:  normal sphincter tone, no lesions  Chaperone, Acquanetta Chain, RN, was present for exam.  Assessment/Plan: 1. Well woman exam with routine gynecological exam - pap smear not indicated - MMG 11/2019 - colonoscopy 2016, follow up 7 years - Plan BMD with MMG this year.  Order already placed and this is scheduled. - vaccines updates - lab work done with Dr. Stephanie Acre  2. H/O: hysterectomy  3. Postmenopausal with  hot flashes - gabapentin (NEURONTIN) 100 MG capsule; Take 1 capsule TID as needed for hot flashes.  Can increase night time dosage to 300mg .  Dispense: 90 capsule; Refill: 3 - advised to give update in a few weeks  4. Bartholin's duct cyst, s/p I&D 12/13 and 11/14

## 2021-01-17 ENCOUNTER — Encounter (HOSPITAL_BASED_OUTPATIENT_CLINIC_OR_DEPARTMENT_OTHER): Payer: Self-pay | Admitting: Obstetrics & Gynecology

## 2021-01-17 DIAGNOSIS — Z90710 Acquired absence of both cervix and uterus: Secondary | ICD-10-CM | POA: Insufficient documentation

## 2021-01-20 DIAGNOSIS — E860 Dehydration: Secondary | ICD-10-CM | POA: Diagnosis not present

## 2021-01-23 DIAGNOSIS — M47816 Spondylosis without myelopathy or radiculopathy, lumbar region: Secondary | ICD-10-CM | POA: Diagnosis not present

## 2021-01-23 DIAGNOSIS — M4310 Spondylolisthesis, site unspecified: Secondary | ICD-10-CM | POA: Diagnosis not present

## 2021-01-31 DIAGNOSIS — U071 COVID-19: Secondary | ICD-10-CM | POA: Diagnosis not present

## 2021-01-31 DIAGNOSIS — M47816 Spondylosis without myelopathy or radiculopathy, lumbar region: Secondary | ICD-10-CM | POA: Diagnosis not present

## 2021-01-31 DIAGNOSIS — M4310 Spondylolisthesis, site unspecified: Secondary | ICD-10-CM | POA: Diagnosis not present

## 2021-02-09 DIAGNOSIS — Z20828 Contact with and (suspected) exposure to other viral communicable diseases: Secondary | ICD-10-CM | POA: Diagnosis not present

## 2021-02-09 DIAGNOSIS — Z20822 Contact with and (suspected) exposure to covid-19: Secondary | ICD-10-CM | POA: Diagnosis not present

## 2021-02-14 ENCOUNTER — Other Ambulatory Visit: Payer: Self-pay

## 2021-02-14 ENCOUNTER — Ambulatory Visit
Admission: RE | Admit: 2021-02-14 | Discharge: 2021-02-14 | Disposition: A | Discharge status: Home / Self Care | Payer: BC Managed Care – PPO | Source: Ambulatory Visit | Attending: Obstetrics & Gynecology | Admitting: Obstetrics & Gynecology

## 2021-02-14 DIAGNOSIS — M8589 Other specified disorders of bone density and structure, multiple sites: Secondary | ICD-10-CM | POA: Diagnosis not present

## 2021-02-14 DIAGNOSIS — Z780 Asymptomatic menopausal state: Secondary | ICD-10-CM | POA: Diagnosis not present

## 2021-02-14 DIAGNOSIS — Z1231 Encounter for screening mammogram for malignant neoplasm of breast: Secondary | ICD-10-CM | POA: Diagnosis not present

## 2021-02-14 DIAGNOSIS — E2839 Other primary ovarian failure: Secondary | ICD-10-CM

## 2021-03-01 DIAGNOSIS — M4310 Spondylolisthesis, site unspecified: Secondary | ICD-10-CM | POA: Diagnosis not present

## 2021-03-05 ENCOUNTER — Ambulatory Visit: Payer: BC Managed Care – PPO

## 2021-08-10 DIAGNOSIS — Z13220 Encounter for screening for lipoid disorders: Secondary | ICD-10-CM | POA: Diagnosis not present

## 2021-08-10 DIAGNOSIS — E559 Vitamin D deficiency, unspecified: Secondary | ICD-10-CM | POA: Diagnosis not present

## 2021-08-10 DIAGNOSIS — Z0000 Encounter for general adult medical examination without abnormal findings: Secondary | ICD-10-CM | POA: Diagnosis not present

## 2021-08-10 DIAGNOSIS — R7303 Prediabetes: Secondary | ICD-10-CM | POA: Diagnosis not present

## 2021-11-18 IMAGING — CT CT ABD-PELV W/O CM
1 of 2 series · 14 of 32 positions shown, 19 images · non-contrast
Comparison: None.

CLINICAL DATA: Low pelvic pain.

EXAM:
CT ABDOMEN AND PELVIS WITHOUT CONTRAST
TECHNIQUE: Multidetector CT imaging of the abdomen and pelvis was performed
following the standard protocol without IV contrast.

[Series 2: abd/pelvis w/(date) · axial · 0.66mm/px · z∈[-450,-80]mm · 14 of 84 slices shown, 19 images]
[im 5/84  soft-tissue]
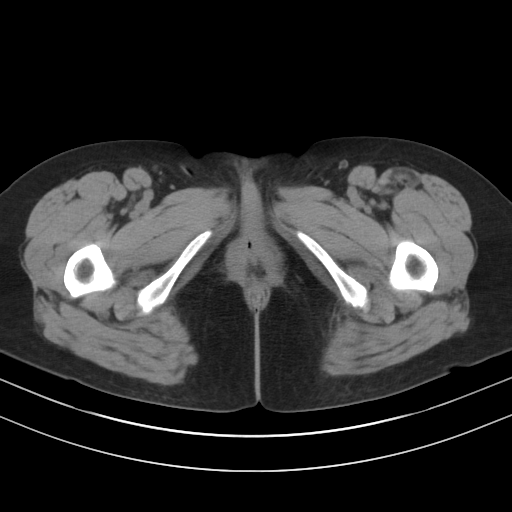
[im 5/84  bone]
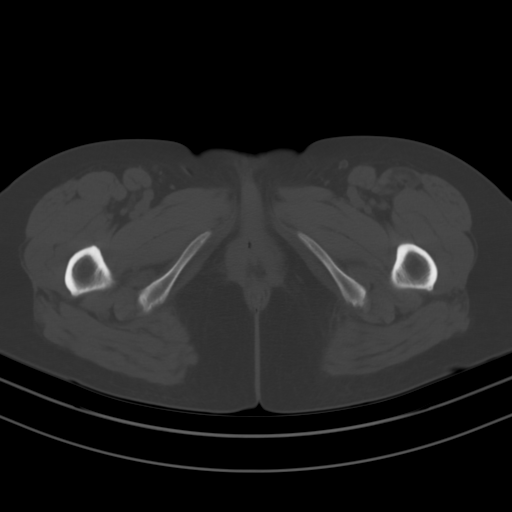
[im 13/84  soft-tissue]
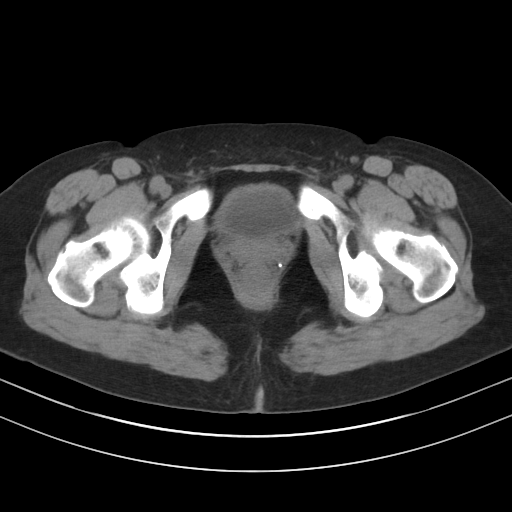
[im 17/84  soft-tissue]
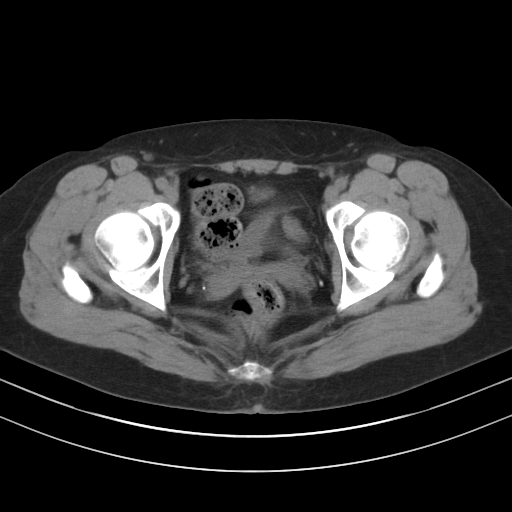
[im 25/84  soft-tissue]
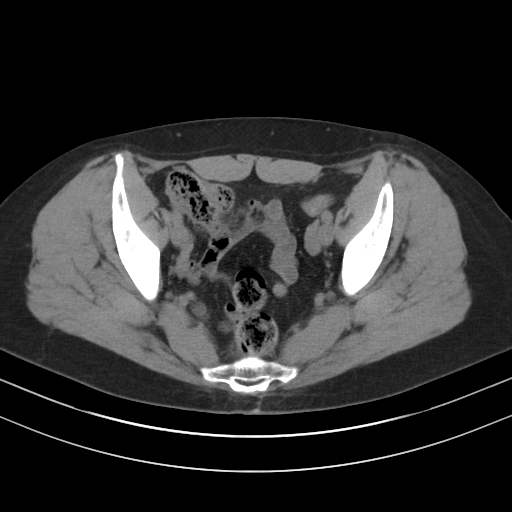
[im 30/84  soft-tissue]
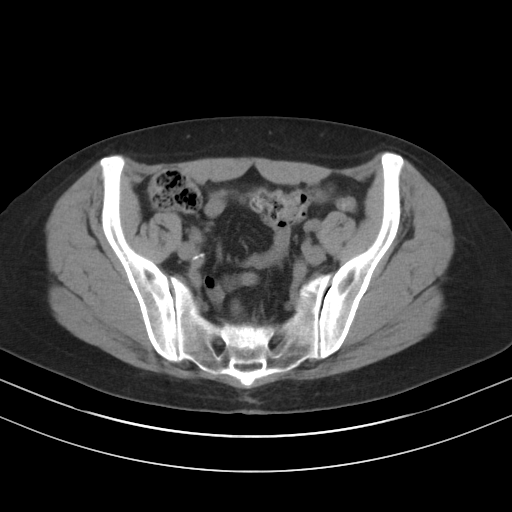
[im 38/84  soft-tissue]
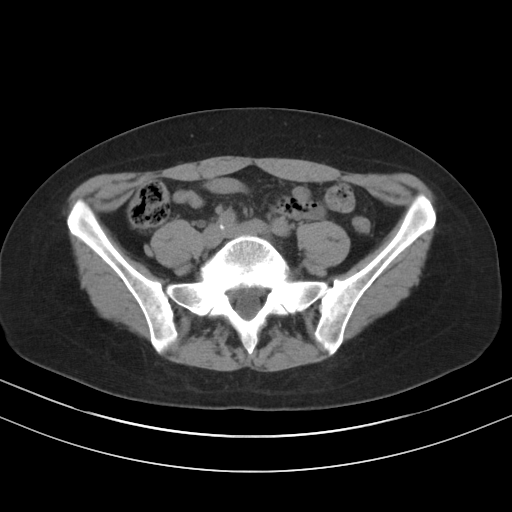
[im 42/84  soft-tissue]
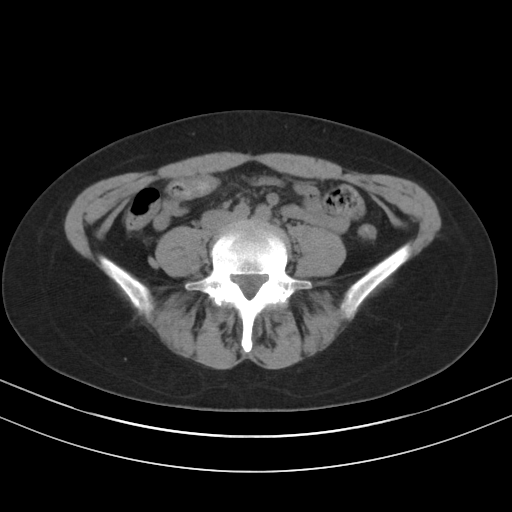
[im 46/84  soft-tissue]
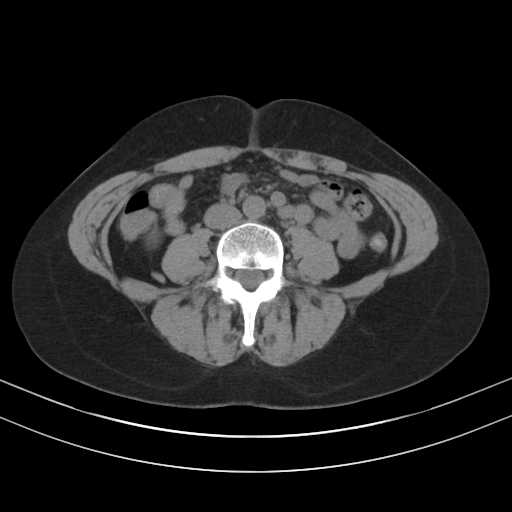
[im 54/84  soft-tissue]
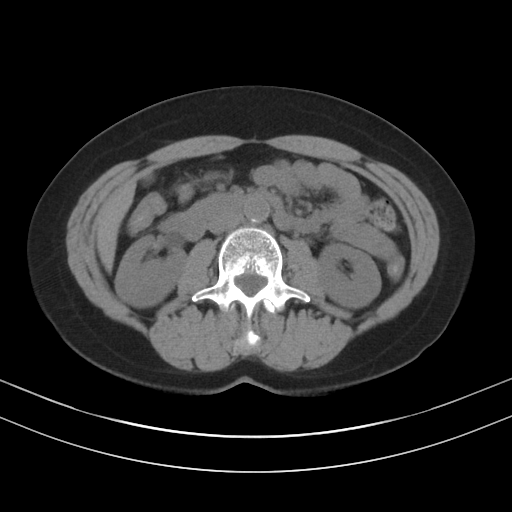
[im 54/84  bone]
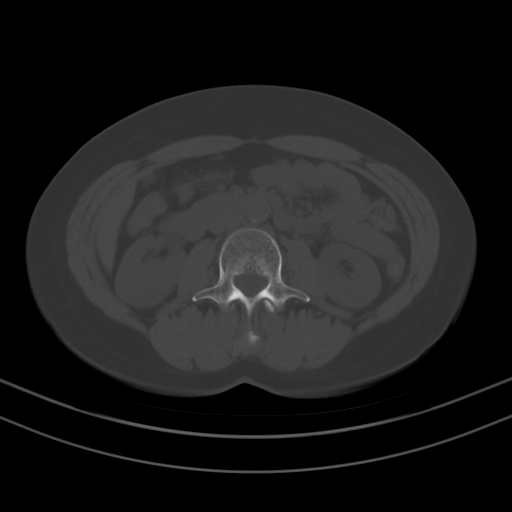
[im 59/84  soft-tissue]
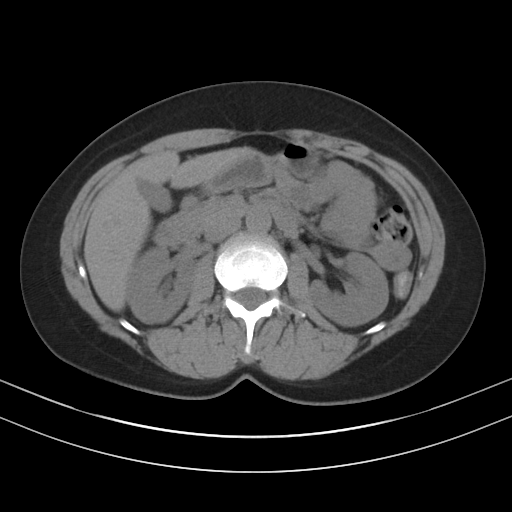
[im 67/84  soft-tissue]
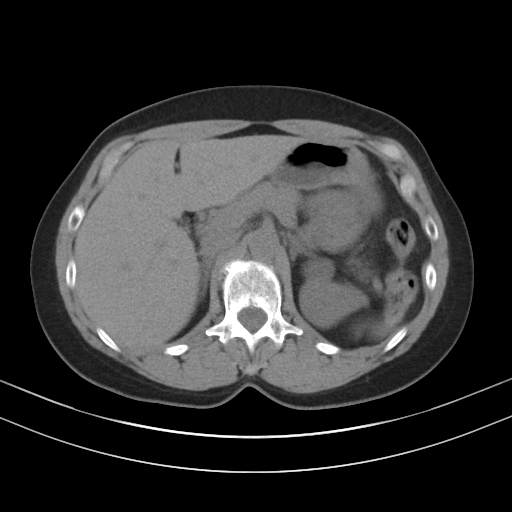
[im 67/84  lung]
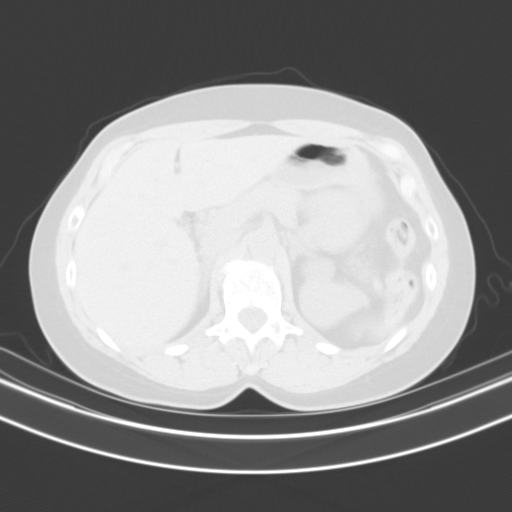
[im 71/84  soft-tissue]
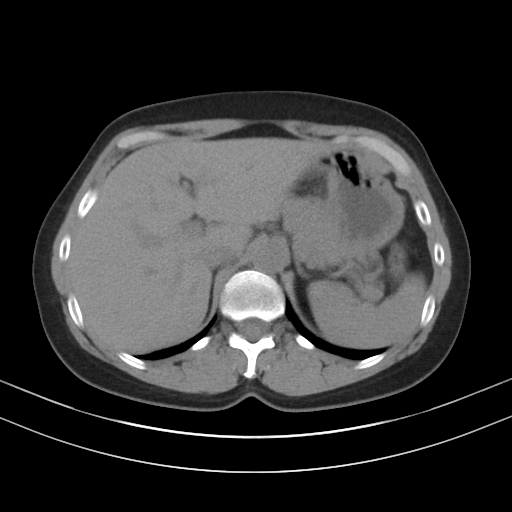
[im 71/84  lung]
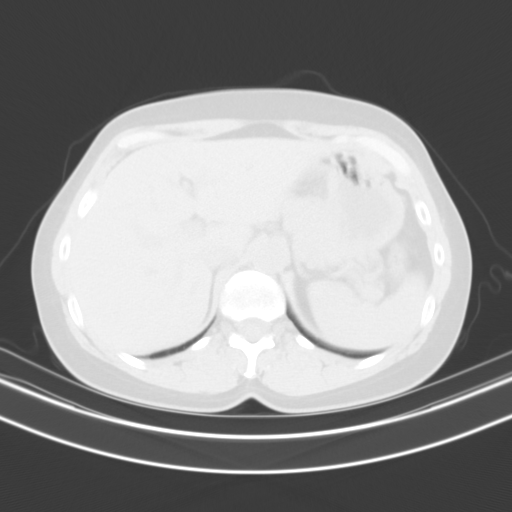
[im 75/84  lung]
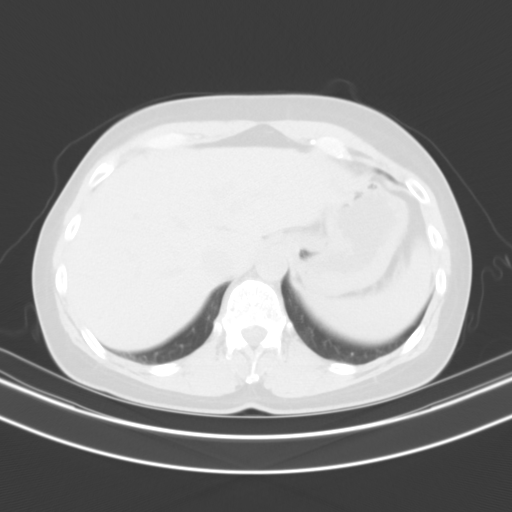
[im 79/84  soft-tissue]
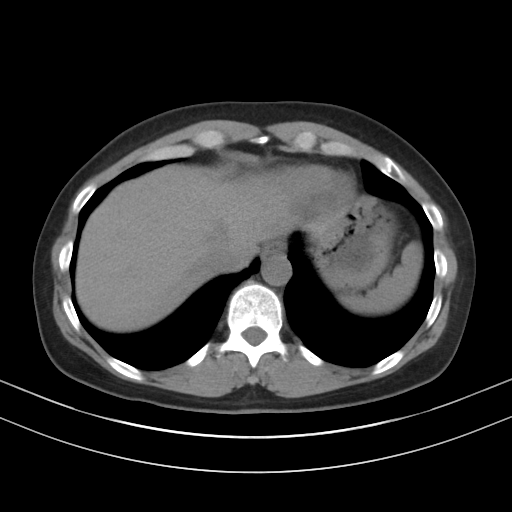
[im 79/84  lung]
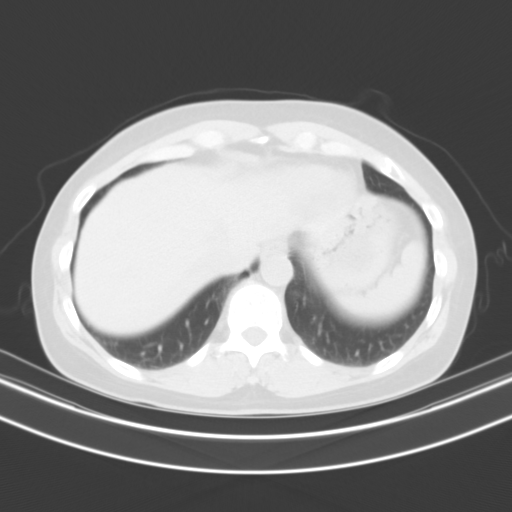

[14 of 32 positions shown; findings below may reference images not displayed]

FINDINGS: Lower chest: No acute abnormality.

Hepatobiliary: No focal liver abnormality is seen. No gallstones,
gallbladder wall thickening, or biliary dilatation.

Pancreas: Unremarkable. No pancreatic ductal dilatation or
surrounding inflammatory changes.

Spleen: Normal in size without focal abnormality.

Adrenals/Urinary Tract: 2.5 cm left adrenal lesion is noted which
most likely represents adenoma. Right adrenal gland is unremarkable.
Left kidney and ureter are unremarkable. Minimal right
hydroureteronephrosis is noted without evidence of obstructing
calculus. Urinary bladder is unremarkable.

Stomach/Bowel: Stomach is within normal limits. Appendix appears
normal. No evidence of bowel wall thickening, distention, or
inflammatory changes.

Vascular/Lymphatic: No significant vascular findings are present. No
enlarged abdominal or pelvic lymph nodes.

Reproductive: Status post hysterectomy. No adnexal masses.

Other: No abdominal wall hernia or abnormality. No abdominopelvic
ascites.

Musculoskeletal: No acute or significant osseous findings.
IMPRESSION: Minimal right hydroureteronephrosis is noted without evidence of
obstructing calculus. Potentially this may be due to distal ureteral
spasm from recently passed calculus.

2.5 cm left adrenal lesion is noted which most likely represents
benign adenoma, but follow-up CT or MRI in 12 months is recommended
to ensure stability.

## 2021-11-26 DIAGNOSIS — M79641 Pain in right hand: Secondary | ICD-10-CM | POA: Diagnosis not present

## 2021-11-26 DIAGNOSIS — H33311 Horseshoe tear of retina without detachment, right eye: Secondary | ICD-10-CM | POA: Diagnosis not present

## 2021-11-26 DIAGNOSIS — C44310 Basal cell carcinoma of skin of unspecified parts of face: Secondary | ICD-10-CM | POA: Diagnosis not present

## 2021-11-26 DIAGNOSIS — H2513 Age-related nuclear cataract, bilateral: Secondary | ICD-10-CM | POA: Diagnosis not present

## 2021-11-26 DIAGNOSIS — H43813 Vitreous degeneration, bilateral: Secondary | ICD-10-CM | POA: Diagnosis not present

## 2021-12-18 ENCOUNTER — Other Ambulatory Visit (HOSPITAL_BASED_OUTPATIENT_CLINIC_OR_DEPARTMENT_OTHER): Payer: Self-pay

## 2021-12-18 DIAGNOSIS — Z780 Asymptomatic menopausal state: Secondary | ICD-10-CM

## 2021-12-18 MED ORDER — GABAPENTIN 100 MG PO CAPS: ORAL_CAPSULE | ORAL | 0 refills | Status: DC

## 2021-12-27 DIAGNOSIS — M65332 Trigger finger, left middle finger: Secondary | ICD-10-CM | POA: Diagnosis not present

## 2021-12-27 DIAGNOSIS — M79642 Pain in left hand: Secondary | ICD-10-CM | POA: Diagnosis not present

## 2021-12-27 DIAGNOSIS — M65342 Trigger finger, left ring finger: Secondary | ICD-10-CM | POA: Diagnosis not present

## 2021-12-27 DIAGNOSIS — M79641 Pain in right hand: Secondary | ICD-10-CM | POA: Diagnosis not present

## 2021-12-28 DIAGNOSIS — M65332 Trigger finger, left middle finger: Secondary | ICD-10-CM | POA: Insufficient documentation

## 2021-12-31 ENCOUNTER — Other Ambulatory Visit: Payer: Self-pay | Admitting: Obstetrics & Gynecology

## 2021-12-31 ENCOUNTER — Telehealth (HOSPITAL_BASED_OUTPATIENT_CLINIC_OR_DEPARTMENT_OTHER): Payer: Self-pay

## 2021-12-31 DIAGNOSIS — Z1231 Encounter for screening mammogram for malignant neoplasm of breast: Secondary | ICD-10-CM

## 2021-12-31 NOTE — Telephone Encounter (Signed)
Patient called today about her gabapentin. I called the pharmacy and they state that they did receive the Rx for gabapentin on 3/29. Insurance states it is too soon to fill. It will be available for pick up on 4/15. ? ?Patient states she has been laid off by the job that she has been an employee with for over 10 years. Her insurance will be good up until 02/20/2022. She is trying to get in to see all of her doctor's before she looses this insurance. Can we take a look at Dr. Ammie Ferrier schedule and fit her in before 02/20/2022? ?

## 2022-01-07 ENCOUNTER — Encounter (HOSPITAL_BASED_OUTPATIENT_CLINIC_OR_DEPARTMENT_OTHER): Payer: Self-pay | Admitting: Obstetrics & Gynecology

## 2022-01-07 ENCOUNTER — Ambulatory Visit (HOSPITAL_BASED_OUTPATIENT_CLINIC_OR_DEPARTMENT_OTHER): Payer: BC Managed Care – PPO | Admitting: Obstetrics & Gynecology

## 2022-01-07 VITALS — BP 129/77 | HR 65 | Ht 61.0 in | Wt 135.6 lb

## 2022-01-07 DIAGNOSIS — Z780 Asymptomatic menopausal state: Secondary | ICD-10-CM

## 2022-01-07 DIAGNOSIS — N951 Menopausal and female climacteric states: Secondary | ICD-10-CM | POA: Diagnosis not present

## 2022-01-07 DIAGNOSIS — M4310 Spondylolisthesis, site unspecified: Secondary | ICD-10-CM | POA: Insufficient documentation

## 2022-01-07 DIAGNOSIS — K635 Polyp of colon: Secondary | ICD-10-CM

## 2022-01-07 MED ORDER — GABAPENTIN 100 MG PO CAPS: ORAL_CAPSULE | ORAL | 4 refills | Status: AC

## 2022-01-11 DIAGNOSIS — M25572 Pain in left ankle and joints of left foot: Secondary | ICD-10-CM | POA: Diagnosis not present

## 2022-01-14 NOTE — Progress Notes (Addendum)
GYNECOLOGY  VISIT ? ?CC:   questions prior to insurance change ? ?HPI: ?56 y.o. G0P0000 Married White or Caucasian female here for to discuss current medical are prior to her insurance ending.  Pt reports she is losing her job after 20+ years.  Company sold and most individuals offered job with new company.  However, a few individuals did not (including her) as the new company already has staffing for work that is included under her job title.  So, before insurance ends, she is reaching out and/or seeing her providers to ensure care if up to date and make sure nothing additional needed before she has a new job. ? ?From gyn standpoint, pt underwent hysterectomy in 2017 so does not need pap smears.  Last MMG was 02/15/2021.  She has this scheduled before the end of May this year.  Last BMD was 02/14/2021 with very early osteopenia.  No treatment needed and repeat testing due in another two to three years.  Last colonoscopy 2016 with follow up 5 years initially recommended but then changed to 7 years.  Pt aware would be now.   ? ?Is using gabapentin for control of hot flashes.  This is working well for her.  Using 1 capsule up to TID.  Discussed dosage and ability to self titrate up if needed.  If needs instructions in the future, recommended she just reach out. ? ?Denies vaginal bleeding.  ? ?Patient Active Problem List  ? Diagnosis Date Noted  ? Degenerative spondylolisthesis 01/07/2022  ? Acquired trigger finger of left middle finger 12/28/2021  ? H/O: hysterectomy 01/17/2021  ? Bartholin cyst 08/17/2013  ? ? ?Past Medical History:  ?Diagnosis Date  ? Arthritis   ? Bartholin's gland abscess   ? I&D, 12/13 and 11/14  ? Basal cell carcinoma (BCC) of face 2019  ? forehead  ? Hemorrhoid   ? Herniated cervical disc   ? Migraines   ? PONV (postoperative nausea and vomiting)   ? ? ?Past Surgical History:  ?Procedure Laterality Date  ? BARTHOLIN CYST MARSUPIALIZATION Right 08/18/2013  ? Procedure: BARTHOLIN CYST  MARSUPIALIZATION;  Surgeon: Azalia Bilis, MD;  Location: [facility];  Service: Gynecology;  Laterality: Right;  ? BASAL CELL CARCINOMA EXCISION  2019  ? CERVICAL DISCECTOMY  12/2004  ? C 5-6  ? COLONOSCOPY    ? CYSTOSCOPY N/A 08/19/2016  ? Procedure: CYSTOSCOPY;  Surgeon: Megan Salon, MD;  Location: [facility];  Service: Gynecology;  Laterality: N/A;  ? DILATATION & CURETTAGE/HYSTEROSCOPY WITH MYOSURE N/A 10/09/2015  ? Procedure: DILATATION & CURETTAGE/HYSTEROSCOPY ;  Surgeon: Megan Salon, MD;  Location: [facility];  Service: Gynecology;  Laterality: N/A;  ? HEMORRHOID SURGERY  3/99 & 7/03  ? X 2  ? LAPAROSCOPIC BILATERAL SALPINGECTOMY Bilateral 08/19/2016  ? Procedure: LAPAROSCOPIC BILATERAL SALPINGECTOMY;  Surgeon: Megan Salon, MD;  Location: [facility];  Service: Gynecology;  Laterality: Bilateral;  ? LAPAROSCOPIC HYSTERECTOMY N/A 08/19/2016  ? Procedure: HYSTERECTOMY TOTAL LAPAROSCOPIC;  Surgeon: Megan Salon, MD;  Location: [facility];  Service: Gynecology;  Laterality: N/A;  ? MOHS SURGERY  07/24/2018  ? PILONIDAL CYST EXCISION  6/97  ? RETINAL TEAR REPAIR CRYOTHERAPY Right 10/2018  ? ROTATOR CUFF REPAIR Left   ? 11/12/16  ? TYMPANOSTOMY TUBE PLACEMENT    ? ? ?MEDS:   ?Current Outpatient Medications on File Prior to Visit  ?Medication Sig Dispense Refill  ? ALPRAZolam (XANAX) 0.25 MG tablet Take 1 tablet (0.25 mg total) by mouth as  needed. 15 tablet 0  ? celecoxib (CELEBREX) 200 MG capsule Take 1 capsule (200 mg total) by mouth daily as needed for moderate pain. Reported on 10/27/2015 30 capsule 0  ? Multiple Vitamins-Calcium (ONE-A-DAY WOMENS PO) Take 1 tablet by mouth daily.     ? ?No current facility-administered medications on file prior to visit.  ? ? ?ALLERGIES: Prilosec [omeprazole] and Vicodin [hydrocodone-acetaminophen] ? ?Family History  ?Problem Relation Age of Onset  ? Diabetes Mother   ? Aneurysm Father   ?     abdominal aneurysm  ? Other Sister 74  ?     ?PE  ? Aneurysm Paternal Uncle   ?     abdominal aneurysm   ? Diabetes Maternal Grandmother   ? Diabetes Maternal Grandfather   ? Stroke Maternal Grandfather   ? Hypertension Maternal Grandfather   ? Breast cancer Neg Hx   ? ? ?SH:  partnered/married, non smoker ? ?Review of Systems  ?All other systems reviewed and are negative. ? ?PHYSICAL EXAMINATION:   ? ?BP 129/77 (BP Location: Right Arm, Patient Position: Sitting, Cuff Size: Normal)   Pulse 65   Ht '5\' 1"'$  (1.549 m) Comment: reported  Wt 135 lb 9.6 oz (61.5 kg)   LMP 08/16/2015   BMI 25.62 kg/m?     ?Physical Exam ?Constitutional:   ?   Appearance: Normal appearance.  ?Neurological:  ?   General: No focal deficit present.  ?   Mental Status: She is alert.  ?Psychiatric:     ?   Mood and Affect: Mood normal.  ? ? ?Assessment/Plan: ?1. Vasomotor symptoms due to menopause ?- refill for gabapentin will be sent to pharmacy ?- gabapentin (NEURONTIN) 100 MG capsule; Take 1 capsule TID for hot flashes.  Dispense: 270 capsule; Refill: 4 ? ?2. Polyp of sigmoid colon, unspecified type ? ?Total time with pt: 23 minutes spent reviewing history, updating health maintenance and updating prescription for pt ? ? ? ? ? ? ?

## 2022-01-23 DIAGNOSIS — M65332 Trigger finger, left middle finger: Secondary | ICD-10-CM | POA: Diagnosis not present

## 2022-01-23 DIAGNOSIS — M65342 Trigger finger, left ring finger: Secondary | ICD-10-CM | POA: Diagnosis not present

## 2022-01-30 DIAGNOSIS — M4310 Spondylolisthesis, site unspecified: Secondary | ICD-10-CM | POA: Diagnosis not present

## 2022-02-06 DIAGNOSIS — M25642 Stiffness of left hand, not elsewhere classified: Secondary | ICD-10-CM | POA: Diagnosis not present

## 2022-02-15 ENCOUNTER — Ambulatory Visit: Payer: BC Managed Care – PPO

## 2022-02-19 ENCOUNTER — Ambulatory Visit
Admission: RE | Admit: 2022-02-19 | Discharge: 2022-02-19 | Disposition: A | Discharge status: Home / Self Care | Payer: BC Managed Care – PPO | Source: Ambulatory Visit | Attending: Obstetrics & Gynecology | Admitting: Obstetrics & Gynecology

## 2022-02-19 DIAGNOSIS — Z1231 Encounter for screening mammogram for malignant neoplasm of breast: Secondary | ICD-10-CM

## 2022-05-21 DIAGNOSIS — H6980 Other specified disorders of Eustachian tube, unspecified ear: Secondary | ICD-10-CM | POA: Diagnosis not present

## 2022-08-21 DIAGNOSIS — M25551 Pain in right hip: Secondary | ICD-10-CM | POA: Diagnosis not present

## 2022-08-21 DIAGNOSIS — M722 Plantar fascial fibromatosis: Secondary | ICD-10-CM | POA: Diagnosis not present

## 2022-08-21 DIAGNOSIS — M25561 Pain in right knee: Secondary | ICD-10-CM | POA: Diagnosis not present

## 2022-08-28 DIAGNOSIS — R7303 Prediabetes: Secondary | ICD-10-CM | POA: Diagnosis not present

## 2022-08-28 DIAGNOSIS — E559 Vitamin D deficiency, unspecified: Secondary | ICD-10-CM | POA: Diagnosis not present

## 2022-08-28 DIAGNOSIS — Z13220 Encounter for screening for lipoid disorders: Secondary | ICD-10-CM | POA: Diagnosis not present

## 2022-08-28 DIAGNOSIS — Z0000 Encounter for general adult medical examination without abnormal findings: Secondary | ICD-10-CM | POA: Diagnosis not present

## 2022-09-03 DIAGNOSIS — M25561 Pain in right knee: Secondary | ICD-10-CM | POA: Diagnosis not present

## 2022-09-03 DIAGNOSIS — M7061 Trochanteric bursitis, right hip: Secondary | ICD-10-CM | POA: Diagnosis not present

## 2022-09-03 DIAGNOSIS — M25551 Pain in right hip: Secondary | ICD-10-CM | POA: Diagnosis not present

## 2022-12-02 DIAGNOSIS — H43813 Vitreous degeneration, bilateral: Secondary | ICD-10-CM | POA: Diagnosis not present

## 2022-12-02 DIAGNOSIS — H33311 Horseshoe tear of retina without detachment, right eye: Secondary | ICD-10-CM | POA: Diagnosis not present

## 2022-12-02 DIAGNOSIS — H2513 Age-related nuclear cataract, bilateral: Secondary | ICD-10-CM | POA: Diagnosis not present

## 2022-12-02 DIAGNOSIS — C44310 Basal cell carcinoma of skin of unspecified parts of face: Secondary | ICD-10-CM | POA: Diagnosis not present

## 2023-01-16 ENCOUNTER — Other Ambulatory Visit: Payer: Self-pay | Admitting: Obstetrics & Gynecology

## 2023-01-16 DIAGNOSIS — Z1231 Encounter for screening mammogram for malignant neoplasm of breast: Secondary | ICD-10-CM

## 2023-02-24 ENCOUNTER — Ambulatory Visit
Admission: RE | Admit: 2023-02-24 | Discharge: 2023-02-24 | Disposition: A | Discharge status: Home / Self Care | Payer: BC Managed Care – PPO | Source: Ambulatory Visit | Attending: Obstetrics & Gynecology | Admitting: Obstetrics & Gynecology

## 2023-02-24 DIAGNOSIS — Z1231 Encounter for screening mammogram for malignant neoplasm of breast: Secondary | ICD-10-CM | POA: Diagnosis not present

## 2023-03-13 ENCOUNTER — Other Ambulatory Visit: Payer: Self-pay | Admitting: Obstetrics & Gynecology

## 2023-03-13 DIAGNOSIS — Z1231 Encounter for screening mammogram for malignant neoplasm of breast: Secondary | ICD-10-CM

## 2023-05-08 DIAGNOSIS — M4310 Spondylolisthesis, site unspecified: Secondary | ICD-10-CM | POA: Diagnosis not present

## 2023-09-05 DIAGNOSIS — Z13220 Encounter for screening for lipoid disorders: Secondary | ICD-10-CM | POA: Diagnosis not present

## 2023-09-05 DIAGNOSIS — Z0000 Encounter for general adult medical examination without abnormal findings: Secondary | ICD-10-CM | POA: Diagnosis not present

## 2023-09-05 DIAGNOSIS — E559 Vitamin D deficiency, unspecified: Secondary | ICD-10-CM | POA: Diagnosis not present

## 2023-09-05 DIAGNOSIS — Z23 Encounter for immunization: Secondary | ICD-10-CM | POA: Diagnosis not present

## 2023-09-05 DIAGNOSIS — R7303 Prediabetes: Secondary | ICD-10-CM | POA: Diagnosis not present

## 2023-12-09 DIAGNOSIS — H43813 Vitreous degeneration, bilateral: Secondary | ICD-10-CM | POA: Diagnosis not present

## 2023-12-09 DIAGNOSIS — H2513 Age-related nuclear cataract, bilateral: Secondary | ICD-10-CM | POA: Diagnosis not present

## 2023-12-09 DIAGNOSIS — C44310 Basal cell carcinoma of skin of unspecified parts of face: Secondary | ICD-10-CM | POA: Diagnosis not present

## 2024-01-20 ENCOUNTER — Telehealth (HOSPITAL_BASED_OUTPATIENT_CLINIC_OR_DEPARTMENT_OTHER): Payer: Self-pay | Admitting: *Deleted

## 2024-01-20 NOTE — Telephone Encounter (Signed)
 LMOVM regarding prescription refill request. Advised pt to call office. Pt needs appt.

## 2024-01-21 ENCOUNTER — Telehealth (HOSPITAL_BASED_OUTPATIENT_CLINIC_OR_DEPARTMENT_OTHER): Payer: Self-pay | Admitting: *Deleted

## 2024-01-21 NOTE — Telephone Encounter (Signed)
 Called pt regarding refill request that was received by the pharmacy for gabapentin . Advised pt that she has not been seen by provider in 2 years and she would need to schedule an appt before a refill can be sent. Pt prefers to contact her PCP to ask for refill and will call us  back if she decides to make an appt. Refill request refused and faxed to pharmacy.

## 2024-02-25 ENCOUNTER — Ambulatory Visit
Admission: RE | Admit: 2024-02-25 | Discharge: 2024-02-25 | Disposition: A | Discharge status: Home / Self Care | Payer: BC Managed Care – PPO | Source: Ambulatory Visit | Attending: Obstetrics & Gynecology | Admitting: Obstetrics & Gynecology

## 2024-02-25 DIAGNOSIS — Z1231 Encounter for screening mammogram for malignant neoplasm of breast: Secondary | ICD-10-CM | POA: Diagnosis not present

## 2024-04-09 DIAGNOSIS — M21619 Bunion of unspecified foot: Secondary | ICD-10-CM | POA: Diagnosis not present

## 2024-04-09 DIAGNOSIS — L600 Ingrowing nail: Secondary | ICD-10-CM | POA: Diagnosis not present

## 2024-04-09 DIAGNOSIS — M79675 Pain in left toe(s): Secondary | ICD-10-CM | POA: Diagnosis not present

## 2024-04-12 ENCOUNTER — Ambulatory Visit: Admitting: Podiatry

## 2024-04-12 ENCOUNTER — Encounter: Payer: Self-pay | Admitting: Podiatry

## 2024-04-12 VITALS — Ht 61.0 in | Wt 135.6 lb

## 2024-04-12 DIAGNOSIS — L600 Ingrowing nail: Secondary | ICD-10-CM

## 2024-04-12 NOTE — Progress Notes (Signed)
 Chief Complaint  Patient presents with   Ingrown Toenail    Pt is here due to ingrow on left great toenail.    Subjective: Patient presents today for evaluation of pain to the medial border left great toe. Patient is concerned for possible ingrown nail.  It is very sensitive to touch.  Patient presents today for further treatment and evaluation.  Past Medical History:  Diagnosis Date   Arthritis    Bartholin's gland abscess    I&D, 12/13 and 11/14   Basal cell carcinoma (BCC) of face 2019   forehead   Hemorrhoid    Herniated cervical disc    Migraines    PONV (postoperative nausea and vomiting)     Past Surgical History:  Procedure Laterality Date   BARTHOLIN CYST MARSUPIALIZATION Right 08/18/2013   Procedure: BARTHOLIN CYST MARSUPIALIZATION;  Surgeon: Randine VEAR Fender, MD;  Location: [facility];  Service: Gynecology;  Laterality: Right;   BASAL CELL CARCINOMA EXCISION  2019   CERVICAL DISCECTOMY  12/2004   C 5-6   COLONOSCOPY     CYSTOSCOPY N/A 08/19/2016   Procedure: CYSTOSCOPY;  Surgeon: Ronal GORMAN Pinal, MD;  Location: [facility];  Service: Gynecology;  Laterality: N/A;   DILATATION & CURETTAGE/HYSTEROSCOPY WITH MYOSURE N/A 10/09/2015   Procedure: DILATATION & CURETTAGE/HYSTEROSCOPY ;  Surgeon: Ronal GORMAN Pinal, MD;  Location: [facility];  Service: Gynecology;  Laterality: N/A;   HEMORRHOID SURGERY  3/99 & 7/03   X 2   LAPAROSCOPIC BILATERAL SALPINGECTOMY Bilateral 08/19/2016   Procedure: LAPAROSCOPIC BILATERAL SALPINGECTOMY;  Surgeon: Ronal GORMAN Pinal, MD;  Location: [facility];  Service: Gynecology;  Laterality: Bilateral;   LAPAROSCOPIC HYSTERECTOMY N/A 08/19/2016   Procedure: HYSTERECTOMY TOTAL LAPAROSCOPIC;  Surgeon: Ronal GORMAN Pinal, MD;  Location: [facility];  Service: Gynecology;  Laterality: N/A;   MOHS SURGERY  07/24/2018   PILONIDAL CYST EXCISION  6/97   RETINAL TEAR REPAIR CRYOTHERAPY Right 10/2018   ROTATOR CUFF REPAIR Left    11/12/16   TYMPANOSTOMY TUBE PLACEMENT      Allergies   Allergen Reactions   Prilosec [Omeprazole] Other (See Comments)    headaches   Vicodin [Hydrocodone-Acetaminophen ] Nausea And Vomiting    Objective:  General: Well developed, nourished, in no acute distress, alert and oriented x3   Dermatology: Skin is warm, dry and supple bilateral.  Medial border left great toe is tender with evidence of an ingrowing nail. Pain on palpation noted to the border of the nail fold. The remaining nails appear unremarkable at this time.   Vascular: DP and PT pulses palpable.  No clinical evidence of vascular compromise  Neruologic: Grossly intact via light touch bilateral.  Musculoskeletal: No pedal deformity noted  Assesement: #1 Paronychia with ingrowing nail medial border left great toe  Plan of Care:  -Patient evaluated.  -Discussed treatment alternatives and plan of care. Explained nail avulsion procedure and post procedure course to patient. -Patient opted for permanent partial nail avulsion of the ingrown portion of the nail.  -Prior to procedure, local anesthesia infiltration utilized using 3 ml of a 50:50 mixture of 2% plain lidocaine  and 0.5% plain marcaine  in a normal hallux block fashion and a betadine prep performed.  -Partial permanent nail avulsion with chemical matrixectomy performed using 3x30sec applications of phenol followed by alcohol flush.  -Light dressing applied.  Post care instructions provided -Return to clinic 3 weeks  Thresa EMERSON Sar, DPM [facility]  Dr. Thresa EMERSON Sar, DPM    [address] [place], [place] [address]                Office [phone]  Fax [phone]

## 2024-06-17 DIAGNOSIS — M5416 Radiculopathy, lumbar region: Secondary | ICD-10-CM | POA: Diagnosis not present

## 2024-06-17 DIAGNOSIS — Z6825 Body mass index (BMI) 25.0-25.9, adult: Secondary | ICD-10-CM | POA: Diagnosis not present

## 2024-06-18 ENCOUNTER — Other Ambulatory Visit: Payer: Self-pay | Admitting: Neurosurgery

## 2024-06-18 DIAGNOSIS — M5416 Radiculopathy, lumbar region: Secondary | ICD-10-CM

## 2024-06-21 ENCOUNTER — Encounter: Payer: Self-pay | Admitting: Neurosurgery

## 2024-06-24 ENCOUNTER — Inpatient Hospital Stay: Admission: RE | Admit: 2024-06-24 | Discharge: 2024-06-24 | Attending: Neurosurgery | Admitting: Neurosurgery

## 2024-06-24 DIAGNOSIS — M5416 Radiculopathy, lumbar region: Secondary | ICD-10-CM

## 2024-06-24 DIAGNOSIS — M5126 Other intervertebral disc displacement, lumbar region: Secondary | ICD-10-CM | POA: Diagnosis not present

## 2024-06-24 DIAGNOSIS — M48061 Spinal stenosis, lumbar region without neurogenic claudication: Secondary | ICD-10-CM | POA: Diagnosis not present

## 2024-06-24 DIAGNOSIS — M47816 Spondylosis without myelopathy or radiculopathy, lumbar region: Secondary | ICD-10-CM | POA: Diagnosis not present

## 2024-06-30 DIAGNOSIS — M48062 Spinal stenosis, lumbar region with neurogenic claudication: Secondary | ICD-10-CM | POA: Diagnosis not present

## 2024-06-30 DIAGNOSIS — M4310 Spondylolisthesis, site unspecified: Secondary | ICD-10-CM | POA: Diagnosis not present

## 2024-07-06 DIAGNOSIS — M48062 Spinal stenosis, lumbar region with neurogenic claudication: Secondary | ICD-10-CM | POA: Diagnosis not present

## 2024-07-15 DIAGNOSIS — M4316 Spondylolisthesis, lumbar region: Secondary | ICD-10-CM | POA: Diagnosis not present

## 2024-07-26 DIAGNOSIS — F419 Anxiety disorder, unspecified: Secondary | ICD-10-CM | POA: Diagnosis not present

## 2024-07-26 DIAGNOSIS — R002 Palpitations: Secondary | ICD-10-CM | POA: Diagnosis not present

## 2024-07-27 DIAGNOSIS — M4316 Spondylolisthesis, lumbar region: Secondary | ICD-10-CM | POA: Diagnosis not present

## 2024-07-29 DIAGNOSIS — M4310 Spondylolisthesis, site unspecified: Secondary | ICD-10-CM | POA: Diagnosis not present

## 2024-07-30 ENCOUNTER — Other Ambulatory Visit (HOSPITAL_COMMUNITY): Payer: Self-pay | Admitting: Student

## 2024-07-30 DIAGNOSIS — E785 Hyperlipidemia, unspecified: Secondary | ICD-10-CM

## 2024-08-16 ENCOUNTER — Ambulatory Visit (HOSPITAL_COMMUNITY)
Admission: RE | Admit: 2024-08-16 | Discharge: 2024-08-16 | Disposition: A | Discharge status: Home / Self Care | Payer: Self-pay | Source: Ambulatory Visit | Attending: Student | Admitting: Student

## 2024-08-16 DIAGNOSIS — Z0189 Encounter for other specified special examinations: Secondary | ICD-10-CM | POA: Diagnosis not present

## 2024-08-16 DIAGNOSIS — E785 Hyperlipidemia, unspecified: Secondary | ICD-10-CM | POA: Insufficient documentation
